# Patient Record
Sex: Male | Born: 1960 | Race: White | Hispanic: No | Marital: Married | Smoking: Never smoker
Health system: Southern US, Community
[De-identification: ages and names within clinical notes are randomized; demographics above are authoritative.]

## PROBLEM LIST (undated history)

## (undated) DIAGNOSIS — E785 Hyperlipidemia, unspecified: Secondary | ICD-10-CM

## (undated) DIAGNOSIS — K219 Gastro-esophageal reflux disease without esophagitis: Secondary | ICD-10-CM

## (undated) DIAGNOSIS — I10 Essential (primary) hypertension: Secondary | ICD-10-CM

## (undated) DIAGNOSIS — L570 Actinic keratosis: Secondary | ICD-10-CM

## (undated) DIAGNOSIS — E109 Type 1 diabetes mellitus without complications: Secondary | ICD-10-CM

## (undated) HISTORY — PX: DENTAL SURGERY: SHX609

## (undated) HISTORY — PX: COLONOSCOPY: SHX174

## (undated) HISTORY — PX: SHOULDER SURGERY: SHX246

## (undated) HISTORY — DX: Actinic keratosis: L57.0

---

## 2012-06-13 ENCOUNTER — Ambulatory Visit (HOSPITAL_COMMUNITY)
Admission: RE | Admit: 2012-06-13 | Discharge: 2012-06-13 | Disposition: A | Discharge status: Home / Self Care | Payer: 59 | Source: Ambulatory Visit | Attending: Specialist | Admitting: Specialist

## 2012-06-13 ENCOUNTER — Other Ambulatory Visit (HOSPITAL_COMMUNITY): Payer: Self-pay | Admitting: Specialist

## 2012-06-13 DIAGNOSIS — Z01818 Encounter for other preprocedural examination: Secondary | ICD-10-CM | POA: Insufficient documentation

## 2012-06-13 DIAGNOSIS — Z139 Encounter for screening, unspecified: Secondary | ICD-10-CM

## 2013-07-12 IMAGING — CR DG ORBITS FOR FOREIGN BODY
2 series · 2 of 2 positions shown · non-contrast
Comparison: None.

CLINICAL DATA: Clearance for MRI.  Foreign body.

ORBITS FOR FOREIGN BODY - 2 VIEW

[w waters (1 of 2)]
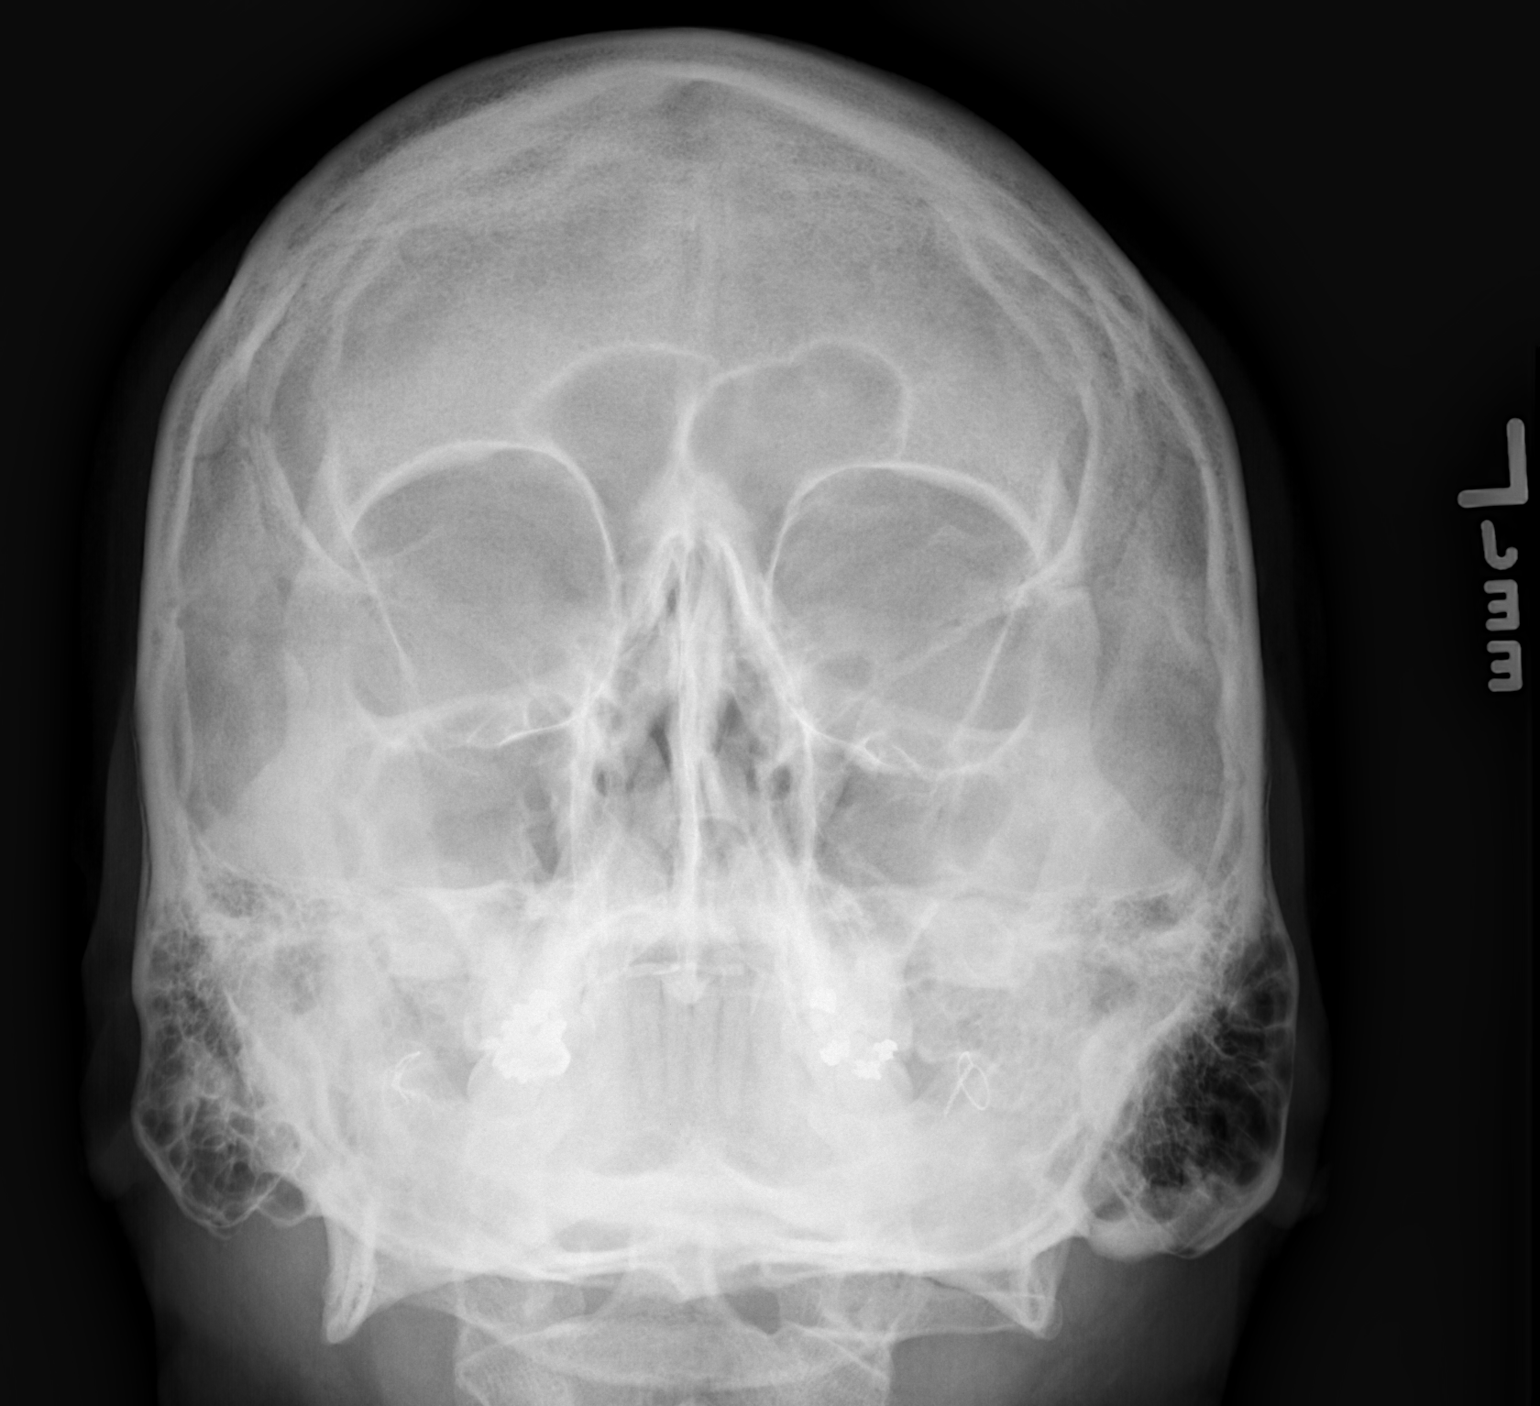

[w waters (2 of 2)]
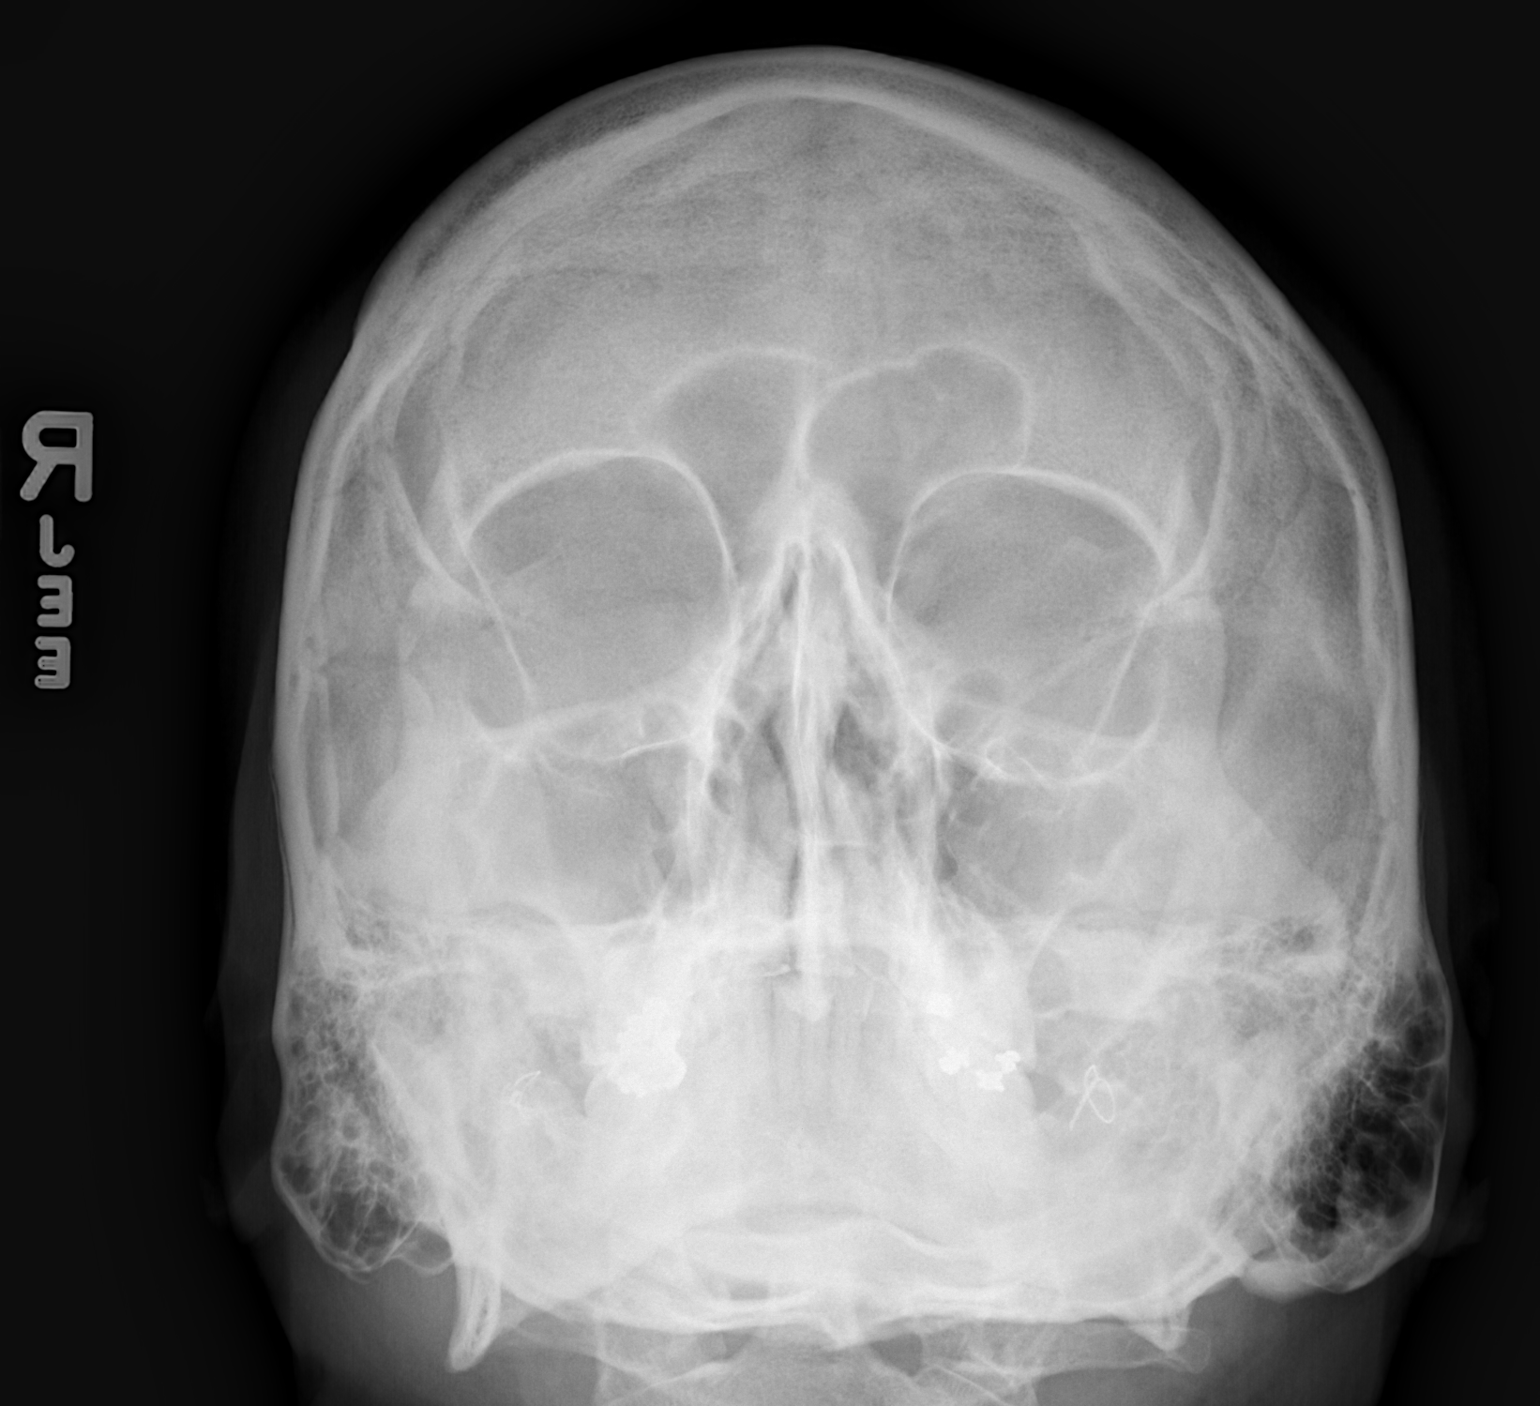

[2 of 2 positions shown; findings below may reference images not displayed]

FINDINGS: Two views of the orbits demonstrate no radiopaque foreign
body.  The sinuses are clear.
IMPRESSION: No radiopaque foreign body.

## 2014-10-02 DIAGNOSIS — D239 Other benign neoplasm of skin, unspecified: Secondary | ICD-10-CM

## 2014-10-02 HISTORY — DX: Other benign neoplasm of skin, unspecified: D23.9

## 2020-11-28 DIAGNOSIS — U071 COVID-19: Secondary | ICD-10-CM

## 2020-11-28 HISTORY — DX: COVID-19: U07.1

## 2021-07-22 ENCOUNTER — Ambulatory Visit: Payer: Self-pay | Admitting: Dermatology

## 2021-08-05 ENCOUNTER — Ambulatory Visit (INDEPENDENT_AMBULATORY_CARE_PROVIDER_SITE_OTHER): Payer: 59 | Admitting: Dermatology

## 2021-08-05 ENCOUNTER — Other Ambulatory Visit: Payer: Self-pay

## 2021-08-05 DIAGNOSIS — D2272 Melanocytic nevi of left lower limb, including hip: Secondary | ICD-10-CM | POA: Diagnosis not present

## 2021-08-05 DIAGNOSIS — D492 Neoplasm of unspecified behavior of bone, soft tissue, and skin: Secondary | ICD-10-CM

## 2021-08-05 DIAGNOSIS — L821 Other seborrheic keratosis: Secondary | ICD-10-CM

## 2021-08-05 DIAGNOSIS — L814 Other melanin hyperpigmentation: Secondary | ICD-10-CM

## 2021-08-05 DIAGNOSIS — L578 Other skin changes due to chronic exposure to nonionizing radiation: Secondary | ICD-10-CM | POA: Diagnosis not present

## 2021-08-05 DIAGNOSIS — L820 Inflamed seborrheic keratosis: Secondary | ICD-10-CM

## 2021-08-05 DIAGNOSIS — Z1283 Encounter for screening for malignant neoplasm of skin: Secondary | ICD-10-CM | POA: Diagnosis not present

## 2021-08-05 DIAGNOSIS — B078 Other viral warts: Secondary | ICD-10-CM

## 2021-08-05 DIAGNOSIS — L570 Actinic keratosis: Secondary | ICD-10-CM | POA: Diagnosis not present

## 2021-08-05 DIAGNOSIS — D229 Melanocytic nevi, unspecified: Secondary | ICD-10-CM

## 2021-08-05 DIAGNOSIS — D1800 Hemangioma unspecified site: Secondary | ICD-10-CM

## 2021-08-05 NOTE — Patient Instructions (Addendum)
Wound Care Instructions  Cleanse wound gently with soap and water once a day then pat dry with clean gauze. Apply a thing coat of Petrolatum (petroleum jelly, "Vaseline") over the wound (unless you have an allergy to this). We recommend that you use a new, sterile tube of Vaseline. Do not pick or remove scabs. Do not remove the yellow or white "healing tissue" from the base of the wound.  Cover the wound with fresh, clean, nonstick gauze and secure with paper tape. You may use Band-Aids in place of gauze and tape if the would is small enough, but would recommend trimming much of the tape off as there is often too much. Sometimes Band-Aids can irritate the skin.  You should call the office for your biopsy report after 1 week if you have not already been contacted.  If you experience any problems, such as abnormal amounts of bleeding, swelling, significant bruising, significant pain, or evidence of infection, please call the office immediately.  FOR ADULT SURGERY PATIENTS: If you need something for pain relief you may take 1 extra strength Tylenol (acetaminophen) AND 2 Ibuprofen (200mg each) together every 4 hours as needed for pain. (do not take these if you are allergic to them or if you have a reason you should not take them.) Typically, you may only need pain medication for 1 to 3 days.    Cryotherapy Aftercare  Wash gently with soap and water everyday.   Apply Vaseline and Band-Aid daily until healed.    If you have any questions or concerns for your doctor, please call our main line at [phone] and press option 4 to reach your doctor's medical assistant. If no one answers, please leave a voicemail as directed and we will return your call as soon as possible. Messages left after 4 pm will be answered the following business day.   You may also send us a message via MyChart. We typically respond to MyChart messages within 1-2 business days.  For prescription refills, please ask your  pharmacy to contact our office. Our fax number is [phone].  If you have an urgent issue when the clinic is closed that cannot wait until the next business day, you can page your doctor at the number below.    Please note that while we do our best to be available for urgent issues outside of office hours, we are not available 24/7.   If you have an urgent issue and are unable to reach us, you may choose to seek medical care at your doctor's office, retail clinic, urgent care center, or emergency room.  If you have a medical emergency, please immediately call 911 or go to the emergency department.  Pager Numbers  - Dr. Kowalski: [phone]  - Dr. Moye: [phone]  - Dr. Stewart: [phone]  In the event of inclement weather, please call our main line at [phone] for an update on the status of any delays or closures.  Dermatology Medication Tips: Please keep the boxes that topical medications come in in order to help keep track of the instructions about where and how to use these. Pharmacies typically print the medication instructions only on the boxes and not directly on the medication tubes.   If your medication is too expensive, please contact our office at [phone] option 4 or send us a message through MyChart.   We are unable to tell what your co-pay for medications will be in advance as this is different depending on your insurance coverage. However,   we may be able to find a substitute medication at lower cost or fill out paperwork to get insurance to cover a needed medication.   If a prior authorization is required to get your medication covered by your insurance company, please allow us 1-2 business days to complete this process.  Drug prices often vary depending on where the prescription is filled and some pharmacies may offer cheaper prices.  The website www.goodrx.com contains coupons for medications through different pharmacies. The prices here do not  account for what the cost may be with help from insurance (it may be cheaper with your insurance), but the website can give you the price if you did not use any insurance.  - You can print the associated coupon and take it with your prescription to the pharmacy.  - You may also stop by our office during regular business hours and pick up a GoodRx coupon card.  - If you need your prescription sent electronically to a different pharmacy, notify our office through [place] MyChart or by phone at [phone] option 4.  

## 2021-08-05 NOTE — Progress Notes (Signed)
New Patient Visit  Subjective  Jack Edwards is a 60 y.o. male who presents for the following: Annual Exam (Mole check ). Pt c/o several spots changing and growing.  The patient presents for Total-Body Skin Exam (TBSE) for skin cancer screening and mole check.   The following portions of the chart were reviewed this encounter and updated as appropriate:   Allergies  Meds  Problems  Med Hx  Surg Hx  Fam Hx     Review of Systems:  No other skin or systemic complaints except as noted in HPI or Assessment and Plan.  Objective  Well appearing patient in no apparent distress; mood and affect are within normal limits.  A full examination was performed including scalp, head, eyes, ears, nose, lips, neck, chest, axillae, abdomen, back, buttocks, bilateral upper extremities, bilateral lower extremities, hands, feet, fingers, toes, fingernails, and toenails. All findings within normal limits unless otherwise noted below.  right infraorbital Erythematous thin papules/macules with gritty scale.   Right Forearm - Anterior Erythematous keratotic or waxy stuck-on papule or plaque.   midline anterior sole 0.7 x 0.3 irregular brown macule      left anterior sole Verrucous papules -- Discussed viral etiology and contagion.    Assessment & Plan  AK (actinic keratosis) right infraorbital  Destruction of lesion - right infraorbital Complexity: simple   Destruction method: cryotherapy   Informed consent: discussed and consent obtained   Timeout:  patient name, date of birth, surgical site, and procedure verified Lesion destroyed using liquid nitrogen: Yes   Region frozen until ice ball extended beyond lesion: Yes   Outcome: patient tolerated procedure well with no complications   Post-procedure details: wound care instructions given    Inflamed seborrheic keratosis Right Forearm - Anterior  Destruction of lesion - Right Forearm - Anterior Complexity: simple   Destruction  method: cryotherapy   Informed consent: discussed and consent obtained   Timeout:  patient name, date of birth, surgical site, and procedure verified Lesion destroyed using liquid nitrogen: Yes   Region frozen until ice ball extended beyond lesion: Yes   Outcome: patient tolerated procedure well with no complications   Post-procedure details: wound care instructions given    Neoplasm of skin midline anterior sole  Epidermal / dermal shaving  Lesion diameter (cm):  0.7 Informed consent: discussed and consent obtained   Timeout: patient name, date of birth, surgical site, and procedure verified   Procedure prep:  Patient was prepped and draped in usual sterile fashion Prep type:  Isopropyl alcohol Anesthesia: the lesion was anesthetized in a standard fashion   Anesthetic:  1% lidocaine w/ epinephrine 1-100,000 buffered w/ 8.4% NaHCO3 Hemostasis achieved with: pressure, aluminum chloride and electrodesiccation   Outcome: patient tolerated procedure well   Post-procedure details: sterile dressing applied and wound care instructions given   Dressing type: bandage and petrolatum    Specimen 1 - Surgical pathology Differential Diagnosis: R/O Dysplastic nevus   Check Margins: No  Irregular brown macule   Other viral warts left anterior sole  Destruction of lesion - left anterior sole Complexity: simple   Destruction method: cryotherapy   Informed consent: discussed and consent obtained   Timeout:  patient name, date of birth, surgical site, and procedure verified Lesion destroyed using liquid nitrogen: Yes   Region frozen until ice ball extended beyond lesion: Yes   Outcome: patient tolerated procedure well with no complications   Post-procedure details: wound care instructions given    Skin cancer screening  Lentigines - Scattered tan macules - Due to sun exposure - Benign-appering, observe - Recommend daily broad spectrum sunscreen SPF 30+ to sun-exposed areas, reapply  every 2 hours as needed. - Call for any changes  Seborrheic Keratoses - Stuck-on, waxy, tan-brown papules and/or plaques  - Benign-appearing - Discussed benign etiology and prognosis. - Observe - Call for any changes  Melanocytic Nevi - Tan-brown and/or pink-flesh-colored symmetric macules and papules - Benign appearing on exam today - Observation - Call clinic for new or changing moles - Recommend daily use of broad spectrum spf 30+ sunscreen to sun-exposed areas.   Hemangiomas - Red papules - Discussed benign nature - Observe - Call for any changes  Actinic Damage - Chronic condition, secondary to cumulative UV/sun exposure - diffuse scaly erythematous macules with underlying dyspigmentation - Recommend daily broad spectrum sunscreen SPF 30+ to sun-exposed areas, reapply every 2 hours as needed.  - Staying in the shade or wearing long sleeves, sun glasses (UVA+UVB protection) and wide brim hats (4-inch brim around the entire circumference of the hat) are also recommended for sun protection.  - Call for new or changing lesions.  Skin cancer screening performed today.   Return in about 1 year (around 08/05/2022) for TBSE.  IMarye Round, CMA, am acting as scribe for Sarina Ser, MD .  Documentation: I have reviewed the above documentation for accuracy and completeness, and I agree with the above.  Sarina Ser, MD

## 2021-08-09 ENCOUNTER — Encounter: Payer: Self-pay | Admitting: Dermatology

## 2021-08-18 ENCOUNTER — Telehealth: Payer: Self-pay

## 2021-08-18 NOTE — Telephone Encounter (Signed)
Discussed biopsy results with pt  °

## 2021-08-18 NOTE — Telephone Encounter (Signed)
-----   Message from Ralene Bathe, MD sent at 08/13/2021  5:42 PM EDT ----- Diagnosis Skin , left midline anterior sole MELANOCYTIC NEVUS, COMPOUND ACRAL TYPE, BASE INVOLVED  Benign mole No further treatment needed Recheck next visit

## 2022-01-07 ENCOUNTER — Encounter: Payer: Self-pay | Admitting: Unknown Physician Specialty

## 2022-01-12 ENCOUNTER — Encounter: Payer: Self-pay | Admitting: Unknown Physician Specialty

## 2022-01-15 NOTE — Discharge Instructions (Signed)
[facility] ENDOSCOPIC SINUS SURGERY [facility]  What is Functional Endoscopic Sinus Surgery?  The Surgery involves making the natural openings of the sinuses larger by removing the bony partitions that separate the sinuses from the nasal cavity.  The natural sinus lining is preserved as much as possible to allow the sinuses to resume normal function after the surgery.  In some patients nasal polyps (excessively swollen lining of the sinuses) may be removed to relieve obstruction of the sinus openings.  The surgery is performed through the nose using lighted scopes, which eliminates the need for incisions on the face.  A septoplasty is a different procedure which is sometimes performed with sinus surgery.  It involves straightening the boy partition that separates the two sides of your nose.  A crooked or deviated septum may need repair if is obstructing the sinuses or nasal airflow.  Turbinate reduction is also often performed during sinus surgery.  The turbinates are bony proturberances from the side walls of the nose which swell and can obstruct the nose in patients with sinus and allergy problems.  Their size can be surgically reduced to help relieve nasal obstruction.  What Can Sinus Surgery Do For Me?  Sinus surgery can reduce the frequency of sinus infections requiring antibiotic treatment.  This can provide improvement in nasal congestion, post-nasal drainage, facial pressure and nasal obstruction.  Surgery will NOT prevent you from ever having an infection again, so it usually only for patients who get infections 4 or more times yearly requiring antibiotics, or for infections that do not clear with antibiotics.  It will not cure nasal allergies, so patients with allergies may still require medication to treat their allergies after surgery. Surgery may improve headaches related to sinusitis, however, some people will continue to  require medication to control sinus headaches related to allergies.  Surgery will do nothing for other forms of headache (migraine, tension or cluster).  What Are the Risks of Endoscopic Sinus Surgery?  Current techniques allow surgery to be performed safely with little risk, however, there are rare complications that patients should be aware of.  Because the sinuses are located around the eyes, there is risk of eye injury, including blindness, though again, this would be quite rare. This is usually a result of bleeding behind the eye during surgery, which can effect vision, though there are treatments to protect the vision and prevent permanent injury. More serious complications would include bleeding inside the brain cavity or damage to the brain.This happens when the fluid around the brain leaks out into the sinus cavity.  Again, all of these complications are uncommon, and spinal fluid leaks can be safely managed surgically if they occur.  The most common complication of sinus surgery is bleeding from the nose, which may require packing or cauterization of the nose.  Patients with polyps may experience recurrence of the polyps that would require revision surgery.  Alterations of sense of smell or injury to the tear ducts are also rare complications.   What is the Surgery Like, and what is the Recovery?  The Surgery usually takes a couple of hours to perform, and is usually performed under a general anesthetic (completely asleep).  Patients are usually discharged home after a couple of hours.  Sometimes during surgery it is necessary to pack the nose to control bleeding, and the packing is left in place for 24 - 48 hours, and removed by your surgeon.  If   a septoplasty was performed during the procedure, there is often a splint placed which must be removed after 5-7 days.   Discomfort: Pain is usually mild to moderate, and can be controlled by prescription pain medication or acetaminophen (Tylenol).   Aspirin, Ibuprofen (Advil, Motrin), or Naprosyn (Aleve) should be avoided, as they can cause increased bleeding.  Most patients feel sinus pressure like they have a bad head cold for several days.  Sleeping with your head elevated can help reduce swelling and facial pressure, as can ice packs over the face.  A humidifier may be helpful to keep the mucous and blood from drying in the nose.   Diet: There are no specific diet restrictions, however, you should generally start with clear liquids and a light diet of bland foods because the anesthetic can cause some nausea.  Advance your diet depending on how your stomach feels.  Taking your pain medication with food will often help reduce stomach upset which pain medications can cause.  Nasal Saline Irrigation: It is important to remove blood clots and dried mucous from the nose as it is healing.  This is done by having you irrigate the nose at least 3 - 4 times daily with a salt water solution.  We recommend using NeilMed Sinus Rinse (available at the drug store).  Fill the squeeze bottle with the solution, bend over a sink, and insert the tip of the squeeze bottle into the nose  of an inch.  Point the tip of the squeeze bottle towards the inside corner of the eye on the same side your irrigating.  Squeeze the bottle and gently irrigate the nose.  If you bend forward as you do this, most of the fluid will flow back out of the nose, instead of down your throat.   The solution should be warm, near body temperature, when you irrigate.   Each time you irrigate, you should use a full squeeze bottle.   Note that if you are instructed to use Nasal Steroid Sprays at any time after your surgery, irrigate with saline BEFORE using the steroid spray, so you do not wash it all out of the nose. Another product, Nasal Saline Gel (such as AYR Nasal Saline Gel) can be applied in each nostril 3 - 4 times daily to moisture the nose and reduce scabbing or crusting.  Bleeding:   Bloody drainage from the nose can be expected for several days, and patients are instructed to irrigate their nose frequently with salt water to help remove mucous and blood clots.  The drainage may be dark red or brown, though some fresh blood may be seen intermittently, especially after irrigation.  Do not blow you nose, as bleeding may occur. If you must sneeze, keep your mouth open to allow air to escape through your mouth.  If heavy bleeding occurs: Irrigate the nose with saline to rinse out clots, then spray the nose 3 - 4 times with Afrin Nasal Decongestant Spray.  The spray will constrict the blood vessels to slow bleeding.  Pinch the lower half of your nose shut to apply pressure, and lay down with your head elevated.  Ice packs over the nose may help as well. If bleeding persists despite these measures, you should notify your doctor.  Do not use the Afrin routinely to control nasal congestion after surgery, as it can result in worsening congestion and may affect healing.     Activity: Return to work varies among patients. Most patients will be out   of work at least 5 - 7 days to recover.  Patient may return to work after they are off of narcotic pain medication, and feeling well enough to perform the functions of their job.  Patients must avoid heavy lifting (over 10 pounds) or strenuous physical for 2 weeks after surgery, so your employer may need to assign you to light duty, or keep you out of work longer if light duty is not possible.  NOTE: you should not drive, operate dangerous machinery, do any mentally demanding tasks or make any important legal or financial decisions while on narcotic pain medication and recovering from the general anesthetic.    Call Your Doctor Immediately if You Have Any of the Following: Bleeding that you cannot control with the above measures Loss of vision, double vision, bulging of the eye or black eyes. Fever over 101 degrees Neck stiffness with severe headache,  fever, nausea and change in mental state. You are always encouraged to call anytime with concerns, however, please call with requests for pain medication refills during office hours.  Office Endoscopy: During follow-up visits your doctor will remove any packing or splints that may have been placed and evaluate and clean your sinuses endoscopically.  Topical anesthetic will be used to make this as comfortable as possible, though you may want to take your pain medication prior to the visit.  How often this will need to be done varies from patient to patient.  After complete recovery from the surgery, you may need follow-up endoscopy from time to time, particularly if there is concern of recurrent infection or nasal polyps.  

## 2022-01-22 ENCOUNTER — Other Ambulatory Visit: Payer: Self-pay

## 2022-01-22 ENCOUNTER — Ambulatory Visit
Admission: RE | Admit: 2022-01-22 | Discharge: 2022-01-22 | Disposition: A | Discharge status: Home / Self Care | Payer: 59 | Source: Ambulatory Visit | Attending: Unknown Physician Specialty | Admitting: Unknown Physician Specialty

## 2022-01-22 ENCOUNTER — Encounter: Payer: Self-pay | Admitting: Unknown Physician Specialty

## 2022-01-22 ENCOUNTER — Ambulatory Visit: Payer: 59 | Admitting: Anesthesiology

## 2022-01-22 ENCOUNTER — Encounter
Admission: RE | Disposition: A | Discharge status: Home / Self Care | Payer: Self-pay | Source: Ambulatory Visit | Attending: Unknown Physician Specialty

## 2022-01-22 DIAGNOSIS — J343 Hypertrophy of nasal turbinates: Secondary | ICD-10-CM | POA: Diagnosis present

## 2022-01-22 DIAGNOSIS — E669 Obesity, unspecified: Secondary | ICD-10-CM | POA: Diagnosis not present

## 2022-01-22 DIAGNOSIS — Z6833 Body mass index (BMI) 33.0-33.9, adult: Secondary | ICD-10-CM | POA: Insufficient documentation

## 2022-01-22 HISTORY — DX: Type 1 diabetes mellitus without complications: E10.9

## 2022-01-22 HISTORY — DX: Hyperlipidemia, unspecified: E78.5

## 2022-01-22 HISTORY — PX: NASAL TURBINATE REDUCTION: SHX2072

## 2022-01-22 HISTORY — DX: Gastro-esophageal reflux disease without esophagitis: K21.9

## 2022-01-22 HISTORY — DX: Essential (primary) hypertension: I10

## 2022-01-22 LAB — GLUCOSE, CAPILLARY
Glucose-Capillary: 119 mg/dL — ABNORMAL HIGH (ref 70–99)
Glucose-Capillary: 94 mg/dL (ref 70–99)

## 2022-01-22 SURGERY — EXCISION, NASAL TURBINATE, SUBMUCOSAL
Anesthesia: General | Site: Nose | Laterality: Bilateral

## 2022-01-22 MED ORDER — ONDANSETRON HCL 4 MG/2ML IJ SOLN
INTRAMUSCULAR | Status: DC | PRN
  Administered 2022-01-22: 4 mg via INTRAVENOUS

## 2022-01-22 MED ORDER — SUCCINYLCHOLINE CHLORIDE 200 MG/10ML IV SOSY
PREFILLED_SYRINGE | INTRAVENOUS | Status: DC | PRN
  Administered 2022-01-22: 110 mg via INTRAVENOUS

## 2022-01-22 MED ORDER — FENTANYL CITRATE (PF) 100 MCG/2ML IJ SOLN
INTRAMUSCULAR | Status: DC | PRN
Start: 2022-01-22 — End: 2022-01-22
  Administered 2022-01-22: 100 ug via INTRAVENOUS

## 2022-01-22 MED ORDER — EPHEDRINE SULFATE (PRESSORS) 50 MG/ML IJ SOLN
INTRAMUSCULAR | Status: DC | PRN
Start: 2022-01-22 — End: 2022-01-22
  Administered 2022-01-22 (×2): 10 mg via INTRAVENOUS

## 2022-01-22 MED ORDER — OXYMETAZOLINE HCL 0.05 % NA SOLN
6.0000 | Freq: Once | NASAL | Status: AC
  Administered 2022-01-22: 6 via NASAL

## 2022-01-22 MED ORDER — PHENYLEPHRINE HCL 0.5 % NA SOLN
NASAL | Status: DC | PRN
  Administered 2022-01-22: 15 mL via TOPICAL

## 2022-01-22 MED ORDER — DEXMEDETOMIDINE (PRECEDEX) IN NS 20 MCG/5ML (4 MCG/ML) IV SYRINGE
PREFILLED_SYRINGE | INTRAVENOUS | Status: DC | PRN
  Administered 2022-01-22: 10 ug via INTRAVENOUS

## 2022-01-22 MED ORDER — PHENYLEPHRINE HCL (PRESSORS) 10 MG/ML IV SOLN
INTRAVENOUS | Status: DC | PRN
  Administered 2022-01-22: 50 ug via INTRAVENOUS

## 2022-01-22 MED ORDER — LIDOCAINE HCL (CARDIAC) PF 100 MG/5ML IV SOSY
PREFILLED_SYRINGE | INTRAVENOUS | Status: DC | PRN
Start: 2022-01-22 — End: 2022-01-22
  Administered 2022-01-22: 50 mg via INTRAVENOUS

## 2022-01-22 MED ORDER — HYDROCODONE-ACETAMINOPHEN 5-300 MG PO TABS: 1.0000 | ORAL_TABLET | ORAL | 0 refills | Status: DC | PRN

## 2022-01-22 MED ORDER — SULFAMETHOXAZOLE-TRIMETHOPRIM 800-160 MG PO TABS: 1.0000 | ORAL_TABLET | Freq: Two times a day (BID) | ORAL | 0 refills | Status: DC

## 2022-01-22 MED ORDER — DEXAMETHASONE SODIUM PHOSPHATE 4 MG/ML IJ SOLN
INTRAMUSCULAR | Status: DC | PRN
  Administered 2022-01-22: 8 mg via INTRAVENOUS

## 2022-01-22 MED ORDER — LIDOCAINE-EPINEPHRINE 1 %-1:100000 IJ SOLN
INTRAMUSCULAR | Status: DC | PRN
  Administered 2022-01-22: 3 mL

## 2022-01-22 MED ORDER — LACTATED RINGERS IV SOLN: INTRAVENOUS | Status: DC

## 2022-01-22 MED ORDER — GLYCOPYRROLATE 0.2 MG/ML IJ SOLN
INTRAMUSCULAR | Status: DC | PRN
Start: 2022-01-22 — End: 2022-01-22
  Administered 2022-01-22: .1 mg via INTRAVENOUS

## 2022-01-22 MED ORDER — MIDAZOLAM HCL 5 MG/5ML IJ SOLN
INTRAMUSCULAR | Status: DC | PRN
Start: 2022-01-22 — End: 2022-01-22
  Administered 2022-01-22: 2 mg via INTRAVENOUS

## 2022-01-22 MED ORDER — PROPOFOL 10 MG/ML IV BOLUS
INTRAVENOUS | Status: DC | PRN
  Administered 2022-01-22: 200 mg via INTRAVENOUS

## 2022-01-22 SURGICAL SUPPLY — 25 items
COAG SUCT 10F 3.5MM HAND CTRL (MISCELLANEOUS) ×2 IMPLANT
DRAPE HEAD BAR (DRAPES) ×2 IMPLANT
DRESSING NASL FOAM PST OP SINU (MISCELLANEOUS) IMPLANT
DRSG NASAL FOAM POST OP SINU (MISCELLANEOUS) ×4
ELECT REM PT RETURN 9FT ADLT (ELECTROSURGICAL) ×2
ELECTRODE REM PT RTRN 9FT ADLT (ELECTROSURGICAL) ×1 IMPLANT
GLOVE SURG ENC TEXT LTX SZ7.5 (GLOVE) ×4 IMPLANT
HANDLE YANKAUER SUCT BULB TIP (MISCELLANEOUS) ×2 IMPLANT
KIT TURNOVER KIT A (KITS) ×2 IMPLANT
NDL HYPO 25GX1X1/2 BEV (NEEDLE) ×1 IMPLANT
NEEDLE HYPO 25GX1X1/2 BEV (NEEDLE) ×2 IMPLANT
PACK ENT CUSTOM (PACKS) ×2 IMPLANT
SPONGE NEURO XRAY DETECT 1X3 (DISPOSABLE) ×2 IMPLANT
STRAP BODY AND KNEE 60X3 (MISCELLANEOUS) ×2 IMPLANT
SUT CHROMIC 3-0 (SUTURE) ×2
SUT CHROMIC 3-0 KS 27XMFL CR (SUTURE) ×1
SUT CHROMIC 5-0 (SUTURE) ×2
SUT CHROMIC 5-0 P2 18XMFL CR (SUTURE) ×1
SUT ETHILON 3-0 KS 30 BLK (SUTURE) ×2 IMPLANT
SUT PLAIN GUT 4-0 (SUTURE) ×1 IMPLANT
SUTURE CHRMC 3-0 KS 27XMFL CR (SUTURE) ×1 IMPLANT
SUTURE CHRMC 5-0 P2 18XMF CR (SUTURE) IMPLANT
SYR 10ML LL (SYRINGE) ×2 IMPLANT
TOWEL OR 17X26 4PK STRL BLUE (TOWEL DISPOSABLE) ×2 IMPLANT
WATER STERILE IRR 250ML POUR (IV SOLUTION) ×2 IMPLANT

## 2022-01-22 NOTE — Anesthesia Preprocedure Evaluation (Signed)
Anesthesia Evaluation  Patient identified by MRN, date of birth, ID band Patient awake    Reviewed: Allergy & Precautions, NPO status , Patient's Chart, lab work & pertinent test results, reviewed documented beta blocker date and time   History of Anesthesia Complications Negative for: history of anesthetic complications  Airway Mallampati: III  TM Distance: >3 FB Neck ROM: Full  Mouth opening: Limited Mouth Opening  Dental   Pulmonary    breath sounds clear to auscultation       Cardiovascular hypertension, (-) angina(-) DOE  Rhythm:Regular Rate:Normal   HLD   Neuro/Psych    GI/Hepatic GERD  ,  Endo/Other  diabetes, Type 1  Renal/GU      Musculoskeletal   Abdominal (+) + obese (BMI 33),   Peds  Hematology   Anesthesia Other Findings   Reproductive/Obstetrics                            Anesthesia Physical Anesthesia Plan  ASA: 2  Anesthesia Plan: General   Post-op Pain Management:    Induction: Intravenous  PONV Risk Score and Plan: 2 and Treatment may vary due to age or medical condition and Ondansetron  Airway Management Planned: Oral ETT  Additional Equipment:   Intra-op Plan:   Post-operative Plan: Extubation in OR  Informed Consent: I have reviewed the patients History and Physical, chart, labs and discussed the procedure including the risks, benefits and alternatives for the proposed anesthesia with the patient or authorized representative who has indicated his/her understanding and acceptance.       Plan Discussed with: CRNA and Anesthesiologist  Anesthesia Plan Comments:         Anesthesia Quick Evaluation

## 2022-01-22 NOTE — Anesthesia Postprocedure Evaluation (Signed)
Anesthesia Post Note  Patient: Jack Edwards  Procedure(s) Performed: [name] REDUCTION/SUBMUCOSAL RESECTION (Bilateral: Nose)     Patient location during evaluation: PACU Anesthesia Type: General Level of consciousness: awake and alert Pain management: pain level controlled Vital Signs Assessment: post-procedure vital signs reviewed and stable Respiratory status: spontaneous breathing, nonlabored ventilation, respiratory function stable and patient connected to nasal cannula oxygen Cardiovascular status: blood pressure returned to baseline and stable Postop Assessment: no apparent nausea or vomiting Anesthetic complications: no   No notable events documented.  Chet Greenley A  Lorene Klimas

## 2022-01-22 NOTE — Anesthesia Procedure Notes (Signed)
Procedure Name: Intubation Date/Time: 01/22/2022 10:14 AM Performed by: Cameron Ali, CRNA Pre-anesthesia Checklist: Patient identified, Emergency Drugs available, Suction available, Patient being monitored and Timeout performed Patient Re-evaluated:Patient Re-evaluated prior to induction Oxygen Delivery Method: Circle system utilized Preoxygenation: Pre-oxygenation with 100% oxygen Induction Type: IV induction Ventilation: Mask ventilation without difficulty Laryngoscope Size: Glidescope and 4 Grade View: Grade I Tube type: Oral Rae Tube size: 8.0 mm Number of attempts: 2 Placement Confirmation: ETT inserted through vocal cords under direct vision, positive ETCO2 and breath sounds checked- equal and bilateral Tube secured with: Tape Dental Injury: Teeth and Oropharynx as per pre-operative assessment  Comments: DVL x 1. No view with MAC 3. Small mouth opening. DVL x 1 with Glidescope as charted.

## 2022-01-22 NOTE — Op Note (Signed)
01/22/2022  10:36 AM    Jack Edwards  [id number]   Pre-Op Dx: Hypertrophy of nasal turbinates  Post-op Dx: SAME  Proc: Bilateral submucous resection of the inferior turbinate  Surg:  Roena Malady  Anes:  GOT  EBL: Less than 10 cc  Comp: None  Findings: Hypertrophied inferior turbinates bilaterally  Procedure: Jack Edwards was identified in the holding area taken the operating placed in supine position.  After general trach anesthesia the table was turned 90 degrees the patient was draped in usual fashion for nasal surgery.  Examination of the nose shows bilateral inferior turbinate hypertrophy.  A topical anesthetic of phenylephrine lidocaine solution was placed within each nostril on cottonoid pledgets.  This was left proximately 5 minutes.  A local anesthetic of 1% lidocaine with 1 100,000 units of epinephrine was used to inject the inferior turbinates bilaterally.  A total of 3 cc was used.  The patient draped usual fashion for sinus surgery beginning on the left-hand side a 15 blade was used to create an incision on the inferior edge of the inferior turbinate.  A superiorly laterally based flap was then elevated the knight scissors were then used to excise the underlying chonchal bone and mucosa.  The superior laterally based flap was then laid back over the turbinate stump and the suction cautery was used to cauterize the inferior surface for hemostasis.  In similar fashion submucous resection was performed on the right with this completed and no active bleeding Stammberger gel was placed beneath each inferior turbinate there was no active bleeding.  Patient presented to return to anesthesia where he was awakened in the operating room and taken to the recovery room in stable condition.  Specimen: None  Dispo:   Good  Plan: Discharged home follow-up 1 week  Roena Malady  01/22/2022 10:36 AM

## 2022-01-22 NOTE — Transfer of Care (Signed)
Immediate Anesthesia Transfer of Care Note  Patient: Jack Edwards  Procedure(s) Performed: [name] REDUCTION/SUBMUCOSAL RESECTION (Bilateral: Nose)  Patient Location: PACU  Anesthesia Type: General  Level of Consciousness: awake, alert  and patient cooperative  Airway and Oxygen Therapy: Patient Spontanous Breathing and Patient connected to supplemental oxygen  Post-op Assessment: Post-op Vital signs reviewed, Patient's Cardiovascular Status Stable, Respiratory Function Stable, Patent Airway and No signs of Nausea or vomiting  Post-op Vital Signs: Reviewed and stable  Complications: No notable events documented.

## 2022-01-22 NOTE — H&P (Signed)
The patient's history has been reviewed, patient examined, no change in status, stable for surgery.  Questions were answered to the patients satisfaction.  

## 2022-04-30 ENCOUNTER — Emergency Department: Payer: 59

## 2022-04-30 ENCOUNTER — Emergency Department
Admission: EM | Admit: 2022-04-30 | Discharge: 2022-04-30 | Disposition: A | Discharge status: Home / Self Care | Payer: 59 | Attending: Emergency Medicine | Admitting: Emergency Medicine

## 2022-04-30 ENCOUNTER — Other Ambulatory Visit: Payer: Self-pay

## 2022-04-30 DIAGNOSIS — E109 Type 1 diabetes mellitus without complications: Secondary | ICD-10-CM | POA: Insufficient documentation

## 2022-04-30 DIAGNOSIS — I10 Essential (primary) hypertension: Secondary | ICD-10-CM | POA: Insufficient documentation

## 2022-04-30 DIAGNOSIS — I2510 Atherosclerotic heart disease of native coronary artery without angina pectoris: Secondary | ICD-10-CM | POA: Insufficient documentation

## 2022-04-30 DIAGNOSIS — R0789 Other chest pain: Secondary | ICD-10-CM | POA: Diagnosis not present

## 2022-04-30 DIAGNOSIS — R072 Precordial pain: Secondary | ICD-10-CM | POA: Diagnosis present

## 2022-04-30 LAB — BASIC METABOLIC PANEL
Anion gap: 6 (ref 5–15)
BUN: 14 mg/dL (ref 8–23)
CO2: 24 mmol/L (ref 22–32)
Calcium: 8.7 mg/dL — ABNORMAL LOW (ref 8.9–10.3)
Chloride: 108 mmol/L (ref 98–111)
Creatinine, Ser: 1.03 mg/dL (ref 0.61–1.24)
GFR, Estimated: >60 mL/min (ref >60)
Glucose, Bld: 122 mg/dL — ABNORMAL HIGH (ref 70–99)
Potassium: 3.9 mmol/L (ref 3.5–5.1)
Sodium: 138 mmol/L (ref 135–145)

## 2022-04-30 LAB — CBC
HCT: 44.2 % (ref 39.0–52.0)
Hemoglobin: 14.5 g/dL (ref 13.0–17.0)
MCH: 27.5 pg (ref 26.0–34.0)
MCHC: 32.8 g/dL (ref 30.0–36.0)
MCV: 83.7 fL (ref 80.0–100.0)
Platelets: 247 10*3/uL (ref 150–400)
RBC: 5.28 MIL/uL (ref 4.22–5.81)
RDW: 13.4 % (ref 11.5–15.5)
WBC: 7.9 10*3/uL (ref 4.0–10.5)
nRBC: 0 % (ref 0.0–0.2)

## 2022-04-30 LAB — D-DIMER, QUANTITATIVE: D-Dimer, Quant: <0.27 ug/mL-FEU (ref 0.00–0.50)

## 2022-04-30 LAB — TROPONIN I (HIGH SENSITIVITY)
Troponin I (High Sensitivity): 4 ng/L (ref <18)
Troponin I (High Sensitivity): 5 ng/L (ref <18)

## 2022-04-30 NOTE — ED Provider Notes (Signed)
? ?[facility] ?Provider Note ? ? ? Event Date/Time  ? First MD Initiated Contact with Patient 04/30/22 0158   ?  (approximate) ? ? ?History  ? ?Chest Pain ? ? ?HPI ? ?Jack Edwards is a 61 y.o. male who presents to the ED for evaluation of Chest Pain ?  ?I reviewed endocrine clinic visit from 2 months ago.  History of type 1 diabetes, obesity, HTN, HLD on an insulin pump. ? ?Patient presents to the ED for evaluation of sharp right-sided chest pain in the past few hours.  Reports pain starting on the right side of his chest, and now is more substernal.  Reports pleuritic component/worsening of this.  Denies any falls, injuries or trauma.  Denies shortness of breath, cough, fever, syncope, emesis or back pain. ? ?Physical Exam  ? ?Triage Vital Signs: ?ED Triage Vitals  ?Enc Vitals Group  ?   BP 04/30/22 0053 (!) 152/91  ?   Pulse Rate 04/30/22 0053 87  ?   Resp 04/30/22 0053 18  ?   Temp 04/30/22 0053 98.5 ?F (36.9 ?C)  ?   Temp Source 04/30/22 0053 Oral  ?   SpO2 04/30/22 0053 99 %  ?   Weight 04/30/22 0053 260 lb (117.9 kg)  ?   Height 04/30/22 0053 '6\' 4"'$  (1.93 m)  ?   Head Circumference --   ?   Peak Flow --   ?   Pain Score 04/30/22 0052 3  ?   Pain Loc --   ?   Pain Edu? --   ?   Excl. in [place]? --   ? ? ?Most recent vital signs: ?Vitals:  ? 04/30/22 0300 04/30/22 0330  ?BP: 135/86 135/88  ?Pulse: 79 82  ?Resp: 15 17  ?Temp:  98.3 ?F (36.8 ?C)  ?SpO2: 94% 97%  ? ? ?General: Awake, no distress.  Pleasant and conversational in full sentences.  Well-appearing. ?CV:  Good peripheral perfusion. RRR ?Resp:  Normal effort. CTAB ?Abd:  No distention.  ?MSK:  No deformity noted.  Some reproduction with palpation but no overlying skin changes, signs of trauma or rash ?Neuro:  No focal deficits appreciated. Cranial nerves II through XII intact ?5/5 strength and sensation in all 4 extremities ?Other:   ? ? ?ED Results / Procedures / Treatments  ? ?Labs ?(all labs ordered are listed, but only  abnormal results are displayed) ?Labs Reviewed  ?BASIC METABOLIC PANEL - Abnormal; Notable for the following components:  ?    Result Value  ? Glucose, Bld 122 (*)   ? Calcium 8.7 (*)   ? All other components within normal limits  ?CBC  ?D-DIMER, QUANTITATIVE  ?TROPONIN I (HIGH SENSITIVITY)  ?TROPONIN I (HIGH SENSITIVITY)  ? ? ?EKG ?Sinus rhythm, rate of 88 bpm.  Normal axis and intervals.  No evidence of acute ischemia.  No comparison. ? ?RADIOLOGY ?CXR reviewed by me without evidence of acute cardiopulmonary pathology. ? ?Official radiology report(s): ?DG Chest 2 View ? ?Result Date: 04/30/2022 ?CLINICAL DATA:  Chest pain EXAM: CHEST - 2 VIEW COMPARISON:  None Available. FINDINGS: The heart size and mediastinal contours are within normal limits. Both lungs are clear. The visualized skeletal structures are unremarkable. IMPRESSION: Negative. Electronically Signed   By: Rolm Baptise M.D.   On: 04/30/2022 01:29   ? ?PROCEDURES and INTERVENTIONS: ? ?.1-3 Lead EKG Interpretation ?Performed by: Vladimir Crofts, MD ?Authorized by: Vladimir Crofts, MD  ? ?  Interpretation: normal   ?  ECG rate:  80 ?  ECG rate assessment: normal   ?  Rhythm: sinus rhythm   ?  Ectopy: none   ?  Conduction: normal   ? ?Medications - No data to display ? ? ?IMPRESSION / MDM / ASSESSMENT AND PLAN / ED COURSE  ?I reviewed the triage vital signs and the nursing notes. ? ?61 year old male without history of coronary disease presents to the ED with atypical chest pains and a benign work-up ultimately suitable for outpatient management.  Normal vitals and reassuring examination.  Has some reproducible chest pain on palpation without signs of trauma, skin changes, herpes zoster, neurologic or vascular deficits.  Work-up is benign with normal CBC, metabolic panel.  2 negative troponins and a negative D-dimer.  PE and ACS are very unlikely.  Pain is self resolving and he looks well.  CXR is clear without signs of PTX.  I considered observation admission for  this patient but considering such a reassuring work-up and resolving symptoms I think outpatient management is reasonable.  We discussed return precautions. ? ?  ? ? ?FINAL CLINICAL IMPRESSION(S) / ED DIAGNOSES  ? ?Final diagnoses:  ?Other chest pain  ? ? ? ?Rx / DC Orders  ? ?ED Discharge Orders   ? ? None  ? ?  ? ? ? ?Note:  This document was prepared using Dragon voice recognition software and may include unintentional dictation errors. ?  ?Vladimir Crofts, MD ?04/30/22 [phone] ? ?

## 2022-04-30 NOTE — ED Triage Notes (Signed)
Ambulatory to triage with c/o right side chest pain, radiating to shoulder blades. Onset apx 1 hour ago. Woke pt up from sleep, pain is sharp and constant. Denies SOB. Took '81mg'$  ASA before coming in to ED.  ?

## 2022-07-23 ENCOUNTER — Encounter: Payer: Self-pay | Admitting: *Deleted

## 2022-07-23 ENCOUNTER — Other Ambulatory Visit: Payer: Self-pay

## 2022-07-23 ENCOUNTER — Emergency Department: Payer: 59

## 2022-07-23 DIAGNOSIS — Z8616 Personal history of COVID-19: Secondary | ICD-10-CM | POA: Diagnosis not present

## 2022-07-23 DIAGNOSIS — R0789 Other chest pain: Principal | ICD-10-CM | POA: Insufficient documentation

## 2022-07-23 DIAGNOSIS — E109 Type 1 diabetes mellitus without complications: Secondary | ICD-10-CM | POA: Diagnosis not present

## 2022-07-23 DIAGNOSIS — I10 Essential (primary) hypertension: Secondary | ICD-10-CM | POA: Insufficient documentation

## 2022-07-23 DIAGNOSIS — Z794 Long term (current) use of insulin: Secondary | ICD-10-CM | POA: Diagnosis not present

## 2022-07-23 DIAGNOSIS — Z79899 Other long term (current) drug therapy: Secondary | ICD-10-CM | POA: Insufficient documentation

## 2022-07-23 NOTE — ED Triage Notes (Addendum)
Pt brought in via ems from home with chest pain. Pt also reports sob.  No n/v.  Sx began approx 45 minutes ago.   Etoh use today.   Pt alert  speech clear.

## 2022-07-24 ENCOUNTER — Other Ambulatory Visit: Payer: Self-pay

## 2022-07-24 ENCOUNTER — Observation Stay: Admit: 2022-07-24 | Payer: 59

## 2022-07-24 ENCOUNTER — Encounter: Payer: Self-pay | Admitting: Radiology

## 2022-07-24 ENCOUNTER — Emergency Department: Payer: 59

## 2022-07-24 ENCOUNTER — Observation Stay
Admission: EM | Admit: 2022-07-24 | Discharge: 2022-07-24 | Disposition: A | Discharge status: Home / Self Care | Payer: 59 | Attending: Internal Medicine | Admitting: Internal Medicine

## 2022-07-24 DIAGNOSIS — R0789 Other chest pain: Secondary | ICD-10-CM | POA: Diagnosis present

## 2022-07-24 DIAGNOSIS — E785 Hyperlipidemia, unspecified: Secondary | ICD-10-CM | POA: Diagnosis present

## 2022-07-24 DIAGNOSIS — K219 Gastro-esophageal reflux disease without esophagitis: Secondary | ICD-10-CM | POA: Diagnosis not present

## 2022-07-24 DIAGNOSIS — R079 Chest pain, unspecified: Secondary | ICD-10-CM

## 2022-07-24 DIAGNOSIS — I10 Essential (primary) hypertension: Secondary | ICD-10-CM | POA: Diagnosis present

## 2022-07-24 DIAGNOSIS — E109 Type 1 diabetes mellitus without complications: Secondary | ICD-10-CM | POA: Diagnosis present

## 2022-07-24 LAB — BASIC METABOLIC PANEL
Anion gap: 9 (ref 5–15)
BUN: 10 mg/dL (ref 8–23)
CO2: 22 mmol/L (ref 22–32)
Calcium: 8.5 mg/dL — ABNORMAL LOW (ref 8.9–10.3)
Chloride: 107 mmol/L (ref 98–111)
Creatinine, Ser: 1.01 mg/dL (ref 0.61–1.24)
GFR, Estimated: >60 mL/min (ref >60)
Glucose, Bld: 170 mg/dL — ABNORMAL HIGH (ref 70–99)
Potassium: 3.8 mmol/L (ref 3.5–5.1)
Sodium: 138 mmol/L (ref 135–145)

## 2022-07-24 LAB — CREATININE, SERUM
Creatinine, Ser: 0.87 mg/dL (ref 0.61–1.24)
GFR, Estimated: >60 mL/min (ref >60)

## 2022-07-24 LAB — HEPATIC FUNCTION PANEL
ALT: 26 U/L (ref 0–44)
AST: 22 U/L (ref 15–41)
Albumin: 3.9 g/dL (ref 3.5–5.0)
Alkaline Phosphatase: 71 U/L (ref 38–126)
Bilirubin, Direct: 0.1 mg/dL (ref 0.0–0.2)
Indirect Bilirubin: 0.4 mg/dL (ref 0.3–0.9)
Total Bilirubin: 0.5 mg/dL (ref 0.3–1.2)
Total Protein: 6.7 g/dL (ref 6.5–8.1)

## 2022-07-24 LAB — CBC
HCT: 42.4 % (ref 39.0–52.0)
Hemoglobin: 14.1 g/dL (ref 13.0–17.0)
MCH: 28 pg (ref 26.0–34.0)
MCHC: 33.3 g/dL (ref 30.0–36.0)
MCV: 84.3 fL (ref 80.0–100.0)
Platelets: 246 10*3/uL (ref 150–400)
RBC: 5.03 MIL/uL (ref 4.22–5.81)
RDW: 13 % (ref 11.5–15.5)
WBC: 7.3 10*3/uL (ref 4.0–10.5)
nRBC: 0 % (ref 0.0–0.2)

## 2022-07-24 LAB — TROPONIN I (HIGH SENSITIVITY)
Troponin I (High Sensitivity): 5 ng/L (ref <18)
Troponin I (High Sensitivity): 5 ng/L (ref <18)
Troponin I (High Sensitivity): 5 ng/L (ref <18)

## 2022-07-24 LAB — LIPASE, BLOOD: Lipase: 28 U/L (ref 11–51)

## 2022-07-24 LAB — HEMOGLOBIN A1C
Hgb A1c MFr Bld: 5.9 % — ABNORMAL HIGH (ref 4.8–5.6)
Mean Plasma Glucose: 122.63 mg/dL

## 2022-07-24 LAB — HIV ANTIBODY (ROUTINE TESTING W REFLEX): HIV Screen 4th Generation wRfx: NONREACTIVE

## 2022-07-24 LAB — CBG MONITORING, ED
Glucose-Capillary: 164 mg/dL — ABNORMAL HIGH (ref 70–99)
Glucose-Capillary: 155 mg/dL — ABNORMAL HIGH (ref 70–99)

## 2022-07-24 MED ORDER — INSULIN PUMP
SUBCUTANEOUS | Status: DC
  Filled 2022-07-24: qty 1

## 2022-07-24 MED ORDER — ASPIRIN 81 MG PO TBEC
81.0000 mg | DELAYED_RELEASE_TABLET | Freq: Every day | ORAL | 12 refills | Status: AC
Start: 2022-07-25 — End: ?

## 2022-07-24 MED ORDER — INSULIN ASPART 100 UNIT/ML IJ SOLN: 0.0000 [IU] | INTRAMUSCULAR | Status: DC

## 2022-07-24 MED ORDER — ALUM & MAG HYDROXIDE-SIMETH 200-200-20 MG/5ML PO SUSP
30.0000 mL | Freq: Once | ORAL | Status: AC
  Administered 2022-07-24: 30 mL via ORAL
  Filled 2022-07-24: qty 30

## 2022-07-24 MED ORDER — ASPIRIN 81 MG PO CHEW
324.0000 mg | CHEWABLE_TABLET | Freq: Once | ORAL | Status: AC
  Administered 2022-07-24: 324 mg via ORAL
  Filled 2022-07-24: qty 4

## 2022-07-24 MED ORDER — ONDANSETRON HCL 4 MG/2ML IJ SOLN: 4.0000 mg | Freq: Four times a day (QID) | INTRAMUSCULAR | Status: DC | PRN

## 2022-07-24 MED ORDER — INSULIN PUMP
Freq: Three times a day (TID) | SUBCUTANEOUS | Status: DC
  Filled 2022-07-24: qty 1

## 2022-07-24 MED ORDER — ENOXAPARIN SODIUM 40 MG/0.4ML IJ SOSY: 40.0000 mg | PREFILLED_SYRINGE | INTRAMUSCULAR | Status: DC

## 2022-07-24 MED ORDER — NITROGLYCERIN 0.4 MG SL SUBL
0.4000 mg | SUBLINGUAL_TABLET | SUBLINGUAL | Status: DC | PRN
  Filled 2022-07-24: qty 1

## 2022-07-24 MED ORDER — MORPHINE SULFATE (PF) 4 MG/ML IV SOLN
4.0000 mg | Freq: Once | INTRAVENOUS | Status: AC
  Administered 2022-07-24: 4 mg via INTRAVENOUS
  Filled 2022-07-24: qty 1

## 2022-07-24 MED ORDER — LOSARTAN POTASSIUM 50 MG PO TABS
50.0000 mg | ORAL_TABLET | Freq: Every day | ORAL | Status: DC
  Administered 2022-07-24: 50 mg via ORAL
  Filled 2022-07-24: qty 1

## 2022-07-24 MED ORDER — MORPHINE SULFATE (PF) 4 MG/ML IV SOLN: 4.0000 mg | Freq: Once | INTRAVENOUS | Status: DC

## 2022-07-24 MED ORDER — IOHEXOL 350 MG/ML SOLN
100.0000 mL | Freq: Once | INTRAVENOUS | Status: AC | PRN
  Administered 2022-07-24: 100 mL via INTRAVENOUS

## 2022-07-24 MED ORDER — ENOXAPARIN SODIUM 60 MG/0.6ML IJ SOSY
0.5000 mg/kg | PREFILLED_SYRINGE | INTRAMUSCULAR | Status: DC
  Administered 2022-07-24: 60 mg via SUBCUTANEOUS
  Filled 2022-07-24: qty 0.6

## 2022-07-24 MED ORDER — ONDANSETRON HCL 4 MG/2ML IJ SOLN
4.0000 mg | INTRAMUSCULAR | Status: AC
  Administered 2022-07-24: 4 mg via INTRAVENOUS
  Filled 2022-07-24: qty 2

## 2022-07-24 MED ORDER — PANTOPRAZOLE SODIUM 40 MG PO TBEC
40.0000 mg | DELAYED_RELEASE_TABLET | Freq: Every day | ORAL | Status: DC
  Administered 2022-07-24: 40 mg via ORAL
  Filled 2022-07-24: qty 1

## 2022-07-24 MED ORDER — SODIUM CHLORIDE 0.9 % IV SOLN: INTRAVENOUS | Status: AC

## 2022-07-24 MED ORDER — PRAVASTATIN SODIUM 20 MG PO TABS: 20.0000 mg | ORAL_TABLET | Freq: Every day | ORAL | Status: DC

## 2022-07-24 MED ORDER — ASPIRIN 81 MG PO TBEC: 81.0000 mg | DELAYED_RELEASE_TABLET | Freq: Every day | ORAL | Status: DC

## 2022-07-24 MED ORDER — PRAVASTATIN SODIUM 20 MG PO TABS: 20.0000 mg | ORAL_TABLET | Freq: Every day | ORAL | 2 refills | Status: AC

## 2022-07-24 MED ORDER — ACETAMINOPHEN 325 MG PO TABS
650.0000 mg | ORAL_TABLET | ORAL | Status: DC | PRN
  Administered 2022-07-24: 650 mg via ORAL
  Filled 2022-07-24: qty 2

## 2022-07-24 NOTE — ED Notes (Signed)
Dc ppw reviewd. Pt followup and rx info given. Pt provides verbal consent for dc and is ambulatory off unit on foot.

## 2022-07-24 NOTE — Consult Note (Signed)
Jack Edwards is a 61 y.o. male  [id number]  Primary Cardiologist: Neoma Laming Reason for Consultation: Chest pain  HPI: 61 year old white male with a past medical history of insulin requiring diabetes hypertension hyperlipidemia presented to the hospital with chest pain which is when he takes deep breath it gets worse and is also associated with change in position.  He still has intermittent chest pain which is described as pressure type.   Review of Systems: No cough orthopnea PND or leg swelling   Past Medical History:  Diagnosis Date   COVID-19 11/28/2020   GERD (gastroesophageal reflux disease)    Hyperlipidemia    Hypertension    Type 1 diabetes (HCC)     (Not in a hospital admission)     [START ON 07/25/2022] aspirin EC  81 mg Oral Daily   enoxaparin (LOVENOX) injection  0.5 mg/kg Subcutaneous Q24H   insulin pump   Subcutaneous TID WC, HS, 0200   losartan  50 mg Oral Daily   pantoprazole  40 mg Oral Daily   pravastatin  20 mg Oral Daily    Infusions:  sodium chloride 100 mL/hr at 07/24/22 1884    Allergies  Allergen Reactions   Fish Allergy Nausea And Vomiting    (OK with shellfish)   Lisinopril Cough    Patient states he is not allergic to Lisinopril    Social History   Socioeconomic History   Marital status: Married    Spouse name: Not on file   Number of children: Not on file   Years of education: Not on file   Highest education level: Not on file  Occupational History   Not on file  Tobacco Use   Smoking status: Never   Smokeless tobacco: Never  Vaping Use   Vaping Use: Never used  Substance and Sexual Activity   Alcohol use: Yes    Alcohol/week: 5.0 standard drinks of alcohol    Types: 5 Cans of beer per week   Drug use: Never   Sexual activity: Yes  Other Topics Concern   Not on file  Social History Narrative   Not on file   Social Determinants of Health   Financial Resource Strain: Not on file  Food Insecurity: Not on  file  Transportation Needs: Not on file  Physical Activity: Not on file  Stress: Not on file  Social Connections: Not on file  Intimate Partner Violence: Not on file    No family history on file.  PHYSICAL EXAM: Vitals:   07/24/22 0730 07/24/22 0736  BP: (!) 140/82   Pulse: 95   Resp: 17   Temp:  98.4 F (36.9 C)  SpO2: 96%     No intake or output data in the 24 hours ending 07/24/22 0921  General:  Well appearing. No respiratory difficulty HEENT: normal Neck: supple. no JVD. Carotids 2+ bilat; no bruits. No lymphadenopathy or thryomegaly appreciated. Cor: PMI nondisplaced. Regular rate & rhythm. No rubs, gallops or murmurs. Lungs: clear Abdomen: soft, nontender, nondistended. No hepatosplenomegaly. No bruits or masses. Good bowel sounds. Extremities: no cyanosis, clubbing, rash, edema Neuro: alert & oriented x 3, cranial nerves grossly intact. moves all 4 extremities w/o difficulty. Affect pleasant.  ECG: Normal sinus rhythm with minimal half millimeter ST elevation in lead III  Results for orders placed or performed during the hospital encounter of 07/24/22 (from the past 24 hour(s))  Lipase, blood     Status: None   Collection Time: 07/23/22 11:47 PM  Result Value Ref Range   Lipase 28 11 - 51 U/L  Hepatic function panel     Status: None   Collection Time: 07/23/22 11:47 PM  Result Value Ref Range   Total Protein 6.7 6.5 - 8.1 g/dL   Albumin 3.9 3.5 - 5.0 g/dL   AST 22 15 - 41 U/L   ALT 26 0 - 44 U/L   Alkaline Phosphatase 71 38 - 126 U/L   Total Bilirubin 0.5 0.3 - 1.2 mg/dL   Bilirubin, Direct 0.1 0.0 - 0.2 mg/dL   Indirect Bilirubin 0.4 0.3 - 0.9 mg/dL  Basic metabolic panel     Status: Abnormal   Collection Time: 07/23/22 11:50 PM  Result Value Ref Range   Sodium 138 135 - 145 mmol/L   Potassium 3.8 3.5 - 5.1 mmol/L   Chloride 107 98 - 111 mmol/L   CO2 22 22 - 32 mmol/L   Glucose, Bld 170 (H) 70 - 99 mg/dL   BUN 10 8 - 23 mg/dL   Creatinine, Ser 1.01  0.61 - 1.24 mg/dL   Calcium 8.5 (L) 8.9 - 10.3 mg/dL   GFR, Estimated >60 >60 mL/min   Anion gap 9 5 - 15  CBC     Status: None   Collection Time: 07/23/22 11:50 PM  Result Value Ref Range   WBC 7.3 4.0 - 10.5 K/uL   RBC 5.03 4.22 - 5.81 MIL/uL   Hemoglobin 14.1 13.0 - 17.0 g/dL   HCT 42.4 39.0 - 52.0 %   MCV 84.3 80.0 - 100.0 fL   MCH 28.0 26.0 - 34.0 pg   MCHC 33.3 30.0 - 36.0 g/dL   RDW 13.0 11.5 - 15.5 %   Platelets 246 150 - 400 K/uL   nRBC 0.0 0.0 - 0.2 %  Troponin I (High Sensitivity)     Status: None   Collection Time: 07/23/22 11:50 PM  Result Value Ref Range   Troponin I (High Sensitivity) 5 <18 ng/L  Troponin I (High Sensitivity)     Status: None   Collection Time: 07/24/22  3:15 AM  Result Value Ref Range   Troponin I (High Sensitivity) 5 <18 ng/L  Hemoglobin A1c     Status: Abnormal   Collection Time: 07/24/22  6:30 AM  Result Value Ref Range   Hgb A1c MFr Bld 5.9 (H) 4.8 - 5.6 %   Mean Plasma Glucose 122.63 mg/dL  Creatinine, serum     Status: None   Collection Time: 07/24/22  6:30 AM  Result Value Ref Range   Creatinine, Ser 0.87 0.61 - 1.24 mg/dL   GFR, Estimated >60 >60 mL/min  CBG monitoring, ED     Status: Abnormal   Collection Time: 07/24/22  7:27 AM  Result Value Ref Range   Glucose-Capillary 164 (H) 70 - 99 mg/dL   CT Angio Chest/Abd/Pel for Dissection W and/or W/WO  Result Date: 07/24/2022 CLINICAL DATA:  Chest pain and shortness of breath. EXAM: CT ANGIOGRAPHY CHEST, ABDOMEN AND PELVIS TECHNIQUE: Non-contrast CT of the chest was initially obtained. Multidetector CT imaging through the chest, abdomen and pelvis was performed using the standard protocol during bolus administration of intravenous contrast. Multiplanar reconstructed images and MIPs were obtained and reviewed to evaluate the vascular anatomy. RADIATION DOSE REDUCTION: This exam was performed according to the departmental dose-optimization program which includes automated exposure control,  adjustment of the mA and/or kV according to patient size and/or use of iterative reconstruction technique. CONTRAST:  188m OMNIPAQUE IOHEXOL 350  MG/ML SOLN COMPARISON:  None Available. FINDINGS: CTA CHEST FINDINGS Cardiovascular: Preferential opacification of the thoracic aorta. No evidence of thoracic aortic aneurysm or dissection. Normal heart size. No pericardial effusion. Negative pulmonary arteries Mediastinum/Nodes: No mass or adenopathy Lungs/Pleura: There is no edema, consolidation, effusion, or pneumothorax. Musculoskeletal: No acute or aggressive finding. Remote T9 compression fracture. Review of the MIP images confirms the above findings. CTA ABDOMEN AND PELVIS FINDINGS VASCULAR Aorta: Normal caliber aorta without aneurysm, dissection, vasculitis or significant stenosis. Celiac: Unremarkable SMA: Unremarkable Renals: Unremarkable IMA: Unremarkable Inflow: Unremarkable Veins: Unremarkable Review of the MIP images confirms the above findings. NON-VASCULAR Hepatobiliary: No focal liver abnormality.No evidence of biliary obstruction or stone. Pancreas: Unremarkable. Spleen: Unremarkable. Adrenals/Urinary Tract: Negative adrenals. No hydronephrosis or stone. Unremarkable bladder. Stomach/Bowel:  No obstruction. No appendicitis. Lymphatic: No mass or adenopathy. Reproductive:No pathologic findings. Other: No ascites or pneumoperitoneum. Musculoskeletal: No acute abnormalities. L5-S1 disc space narrowing and ridging. Remote L2 compression fracture. Review of the MIP images confirms the above findings. IMPRESSION: Negative CTA.  No acute aortic syndrome. Electronically Signed   By: Jorje Guild M.D.   On: 07/24/2022 04:52   DG Chest 2 View  Result Date: 07/24/2022 CLINICAL DATA:  Chest pain EXAM: CHEST - 2 VIEW COMPARISON:  04/30/2022 FINDINGS: The heart size and mediastinal contours are within normal limits. Both lungs are clear. The visualized skeletal structures are unremarkable. IMPRESSION: No active  cardiopulmonary disease. Electronically Signed   By: Rolm Baptise M.D.   On: 07/24/2022 00:23     ASSESSMENT AND PLAN: Atypical chest pain mostly pleuritic with borderline changes on EKG with half millimeter ST elevation in lead III not very specific.  No evidence of STEMI and no evidence of non-STEMI.  Will observe the patient and decide as to whether do outpatient work-up or inpatient.  At this time he seems pleuritic chest pain if MI is ruled out may discharge the patient with follow-up in the office Monday at 9 AM and 2 stress tests in the office.  Will look at the echocardiogram for wall motion abnormality.  Continue current medication.  Tyquisha Sharps A

## 2022-07-24 NOTE — ED Provider Notes (Signed)
[facility] Provider Note    Event Date/Time   First MD Initiated Contact with Patient 07/24/22 0240     (approximate)   History   Chest Pain   HPI  Jack Edwards is a 61 y.o. male with history of type 1 diabetes but no known heart disease or history of blood clots in the legs of the lungs.  He presents for evaluation of chest pain.  The symptoms started about an hour prior to arrival.  He was brought by EMS.  He said that the pain is in the upper central part of his chest and it makes it hard for him to take a deep breath.  He has had no nausea or vomiting or sweating.  No abdominal pain.  He admits to drinking 5 or 6 beers earlier today but he does not feel like he is having any acid reflux or aspiration episodes.  He is clinically sober at the time of my evaluation.  He has acid reflux and he takes medication for it and he has been compliant with his medications.  He has an insulin pump which is working appropriately.  In addition to 2 diabetes, he also has hyperlipidemia and high blood pressure.  He does not use tobacco.  He has no numbness nor tingling in his extremities.     Physical Exam   Triage Vital Signs: ED Triage Vitals  Enc Vitals Group     BP 07/23/22 2349 127/73     Pulse Rate 07/23/22 2349 89     Resp 07/23/22 2349 20     Temp 07/23/22 2349 98.4 F (36.9 C)     Temp Source 07/23/22 2349 Oral     SpO2 07/23/22 2349 98 %     Weight 07/23/22 2345 120.2 kg (265 lb)     Height 07/23/22 2345 1.93 m ('6\' 4"'$ )     Head Circumference --      Peak Flow --      Pain Score 07/23/22 2346 8     Pain Loc --      Pain Edu? --      Excl. in [place]? --     Most recent vital signs: Vitals:   07/24/22 0316 07/24/22 0415  BP: (!) 165/84 (!) 145/90  Pulse: 97 100  Resp: 19 (!) 21  Temp: 98.2 F (36.8 C)   SpO2: 99% 98%     General: Awake, appears uncomfortable but not in severe distress. CV:  Good peripheral perfusion.  Normal heart sounds.   Borderline tachycardia, regular rhythm. Resp:  Normal effort but pain with deep inspiration.  Lungs are clear to auscultation bilaterally. Abd:  No distention.  No tenderness to palpation the abdomen.  No abdominal bruit or pulsatile mass. Other:  No focal neurological deficits.  Mood and affect are normal under the circumstances.   ED Results / Procedures / Treatments   Labs (all labs ordered are listed, but only abnormal results are displayed) Labs Reviewed  BASIC METABOLIC PANEL - Abnormal; Notable for the following components:      Result Value   Glucose, Bld 170 (*)    Calcium 8.5 (*)    All other components within normal limits  CBC  LIPASE, BLOOD  HEPATIC FUNCTION PANEL  TROPONIN I (HIGH SENSITIVITY)  TROPONIN I (HIGH SENSITIVITY)     EKG  ED ECG REPORT #1 I, Hinda Kehr, the attending physician, personally viewed and interpreted this ECG.  Date: 07/23/2022 EKG Time: 23:  45 Rate: 88 Rhythm: normal sinus rhythm QRS Axis: normal Intervals: normal ST/T Wave abnormalities: normal Narrative Interpretation: no evidence of acute ischemia  ED ECG REPORT #2 I, Hinda Kehr, the attending physician, personally viewed and interpreted this ECG.  Date: 07/24/2022 EKG Time: 4:05 AM Rate: 103 Rhythm: Sinus tachycardia QRS Axis: normal Intervals: normal ST/T Wave abnormalities: Perhaps 1 mm of ST segment elevation in leads V5 and V6 but without significant reciprocal changes  narrative Interpretation: Suggestive of ischemia but not consistent with acute STEMI.   RADIOLOGY I viewed and interpreted the patient's two-view chest x-ray, and there is no evidence of pneumonia or widened mediastinum or pneumothorax.  I also read the radiologist's report, which confirmed no acute findings.  I also viewed and interpreted the patient's CT angio chest/abdomen/pelvis.  I did not identify any acute abnormalities and the radiologist did not either.    PROCEDURES:  Critical Care  performed: No  .1-3 Lead EKG Interpretation  Performed by: Hinda Kehr, MD Authorized by: Hinda Kehr, MD     Interpretation: abnormal     ECG rate:  100   ECG rate assessment: tachycardic     Rhythm: sinus tachycardia     Ectopy: none     Conduction: normal      MEDICATIONS ORDERED IN ED: Medications  morphine (PF) 4 MG/ML injection 4 mg (has no administration in time range)  morphine (PF) 4 MG/ML injection 4 mg (has no administration in time range)  alum & mag hydroxide-simeth (MAALOX/MYLANTA) 200-200-20 MG/5ML suspension 30 mL (30 mLs Oral Given 07/24/22 0315)  morphine (PF) 4 MG/ML injection 4 mg (4 mg Intravenous Given 07/24/22 0406)  ondansetron (ZOFRAN) injection 4 mg (4 mg Intravenous Given 07/24/22 0405)  iohexol (OMNIPAQUE) 350 MG/ML injection 100 mL (100 mLs Intravenous Contrast Given 07/24/22 0431)  aspirin chewable tablet 324 mg (324 mg Oral Given 07/24/22 0544)     IMPRESSION / MDM / ASSESSMENT AND PLAN / ED COURSE  I reviewed the triage vital signs and the nursing notes.                              Differential diagnosis includes, but is not limited to, acid reflux/gastritis, pancreatitis, biliary colic, ACS, AAS, pneumonia, pneumothorax.  Patient's presentation is most consistent with acute presentation with potential threat to life or bodily function.  The patient is on the cardiac monitor to evaluate for evidence of arrhythmia and/or significant heart rate changes.  Vital signs are essentially normal other than some very mild tachypnea.  No hypoxemia.  Physical exam is reassuring.  EKG has some nonspecific changes but no evidence of ischemia.  Labs ordered include high-sensitivity troponin x2, CBC, lipase, hepatic function panel, basic metabolic panel.  Labs are all within normal limits and even his glucose is essentially normal for a type I diabetic at 170.  Negative high-sensitivity troponin.  Normal chest x-ray.  I suspect the patient may be having some  reflux from drinking alcohol.  I suggested we repeat the troponin as planned and I ordered 30 mL of Maalox to see if this helps relieve his symptoms.  We will then reassess.  AAS is possible, but I think unlikely given the whole constellation of symptoms and no significant hypertension or tachycardia that would be more suggestive of aortic dissection.  However I will consider advanced imaging if he continues to feel worse.   Clinical Course as of 07/24/22 0601  Sat Jul 24, 2022  0402 Patient is having worsening pain in spite of taking the GI cocktail (Mylanta).  His blood pressure is going up even though it is not extremely high just about 403J systolic.  However at this point I am concerned about the possibility of AAS.  He is now saying that it feels like it is going through to his back.  I ordered morphine 4 mg IV, Zofran 4 mg IV, repeat EKG, and CTA chest/abdomen/pelvis to rule out AAS. [CF]  [id number] I reassessed the patient.  He was sleeping about awaken easily.  He said he is still having the chest pain, slightly better than before he got the morphine, but it is still present in the center of his chest, radiating somewhat to the back, and worse when he takes deep breaths.  At this point I explained that I do not have a good explanation for his symptoms, but given his ongoing chest pain, the comorbidities of diabetes, hypertension, and hyperlipidemia, I feel that it is most appropriate to admit him to the hospital for further cardiac evaluation and observation.  He agrees with the plan and feels uncomfortable going home due to the ongoing pain.  I will give him a full dose aspirin but hold off on heparin until speaking with the hospitalist.  I am consulting the hospitalist for admission. [CF]  0601 Discussed case by phone with Dr. Damita Dunnings with the hospitalist service who will admit the patient for high risk chest pain. [CF]    Clinical Course User Index [CF] Hinda Kehr, MD     FINAL CLINICAL  IMPRESSION(S) / ED DIAGNOSES   Final diagnoses:  Chest pain, unspecified type     Rx / DC Orders   ED Discharge Orders     None        Note:  This document was prepared using Dragon voice recognition software and may include unintentional dictation errors.   Hinda Kehr, MD 07/24/22 [phone]

## 2022-07-24 NOTE — Assessment & Plan Note (Signed)
Stable Continue statins

## 2022-07-24 NOTE — H&P (Signed)
History and Physical    Patient: Jack Edwards GEX:[id number] DOB: 25-Sep-1961 DOA: 07/24/2022 DOS: the patient was seen and examined on 07/24/2022 PCP: Kirk Ruths, MD  Patient coming from: Home  Chief Complaint:  Chief Complaint  Patient presents with   Chest Pain   HPI: Jack Edwards is a 61 y.o. male with medical history significant for type 1 diabetes mellitus on an insulin pump, obesity, GERD, hypertension, dyslipidemia who presents to the ER for evaluation of chest pain which started about 10 PM the night prior to his admission.  Pain is mostly midsternal, rated 10 x 10 to intensity at its worst and radiates to both shoulders and up to his neck.  He states the pain is worse with inspiration but denies having any cough, no fever, no nausea, no vomiting, no palpitations or diaphoresis. He has a history of reflux but states that this does not feel like his prior episodes. He denies having any abdominal pain, no changes in his bowel habits, no dizziness, no lightheadedness, no urinary symptoms, no blurred vision no focal deficit. He will be referred to observation status for chest pain evaluation.    Review of Systems: As mentioned in the history of present illness. All other systems reviewed and are negative. Past Medical History:  Diagnosis Date   COVID-19 11/28/2020   GERD (gastroesophageal reflux disease)    Hyperlipidemia    Hypertension    Type 1 diabetes ([place])    Past Surgical History:  Procedure Laterality Date   COLONOSCOPY     DENTAL SURGERY     NASAL TURBINATE REDUCTION Bilateral 01/22/2022   Procedure: TURBINATE REDUCTION/SUBMUCOSAL RESECTION;  Surgeon: Beverly Gust, MD;  Location: [facility];  Service: ENT;  Laterality: Bilateral;  Diabetic   SHOULDER SURGERY Left    Social History:  reports that he has never smoked. He has never used smokeless tobacco. He reports current alcohol use of about 5.0 standard drinks of alcohol per week. He  reports that he does not use drugs.  Allergies  Allergen Reactions   Fish Allergy Nausea And Vomiting    (OK with shellfish)   Lisinopril Cough    Patient states he is not allergic to Lisinopril    No family history on file.  Prior to Admission medications   Medication Sig Start Date End Date Taking? Authorizing Provider  Ascorbic Acid (VITAMIN C PO) Take by mouth daily.    [provider]  HYDROcodone-Acetaminophen 5-300 MG TABS Take 1-2 tablets by mouth every 4 (four) hours as needed. 01/22/22   Beverly Gust, MD  INSULIN ASPART IJ Inject as directed. In insulin pump    [provider]  losartan (COZAAR) 50 MG tablet Take 50 mg by mouth daily.    [provider]  Multiple Vitamins-Minerals (ZINC PO) Take by mouth daily.    [provider]  omeprazole (PRILOSEC) 40 MG capsule Take 40 mg by mouth daily.    [provider]  pravastatin (PRAVACHOL) 20 MG tablet Take 20 mg by mouth daily.    [provider]  sulfamethoxazole-trimethoprim (BACTRIM DS) 800-160 MG tablet Take 1 tablet by mouth 2 (two) times daily. 01/22/22   Beverly Gust, MD  Thiamine HCl (VITAMIN B-1 PO) Take by mouth daily.    [provider]  VITAMIN D PO Take by mouth daily.    [provider]    Physical Exam: Vitals:   07/24/22 0545 07/24/22 0600 07/24/22 0730 07/24/22 0736  BP: (!) 144/88 Marland Kitchen)  143/83 (!) 140/82   Pulse: (!) 103 (!) 116 95   Resp: 18 (!) 25 17   Temp:    98.4 F (36.9 C)  TempSrc:    Oral  SpO2: 97% 99% 96%   Weight:      Height:       Physical Exam Vitals and nursing note reviewed.  Constitutional:      Appearance: He is well-developed.  HENT:     Head: Normocephalic and atraumatic.  Eyes:     Pupils: Pupils are equal, round, and reactive to light.  Cardiovascular:     Rate and Rhythm: Normal rate and regular rhythm.     Heart sounds: Normal heart sounds.  Pulmonary:     Effort: Pulmonary effort is normal.      Breath sounds: Normal breath sounds.  Chest:     Comments: Nonreproducible chest pain  Abdominal:     Palpations: Abdomen is soft.  Musculoskeletal:        General: Normal range of motion.     Cervical back: Normal range of motion.  Skin:    General: Skin is warm and dry.  Neurological:     General: No focal deficit present.     Mental Status: He is alert.  Psychiatric:        Mood and Affect: Mood normal.        Behavior: Behavior normal.     Data Reviewed: Relevant notes from primary care and specialist visits, past discharge summaries as available in EHR, including Care Everywhere. Prior diagnostic testing as pertinent to current admission diagnoses Updated medications and problem lists for reconciliation ED course, including vitals, labs, imaging, treatment and response to treatment Triage notes, nursing and pharmacy notes and ED provider's notes Notable results as noted in HPI Labs reviewed.  Troponin 5, lipase 28, total protein 6.7, albumin 3.9, AST 22, ALT 26, alkaline phosphatase 71, total bilirubin 0.5, sodium 138, potassium 3.8, chloride 107, bicarb 22, glucose 170, BUN 10, creatinine 1.01, calcium 8.5, white count 7.3, hemoglobin 14.1, hematocrit 42.4, platelet count 246 Chest x-ray shows no evidence of acute cardio pulmonary disease Twelve-lead EKG reviewed shows normal sinus rhythm.  Inferior infarct age undetermined. There are no new results to review at this time.  Assessment and Plan: * Chest pain Patient presents to the ER for evaluation of midsternal chest pain with radiation to both shoulders and neck. He continues to have pain but improved from admission 2 sets of cardiac enzymes are negative Consult cardiology for further recommendations Obtain 2D echocardiogram to assess LVEF and rule out regional wall motion abnormality Continue aspirin, Lovenox and statin  GERD (gastroesophageal reflux disease) Stable Continue  PPI  Hyperlipidemia Stable Continue statins  Hypertension Blood pressure is stable Continue losartan  Type 1 diabetes (HCC) Maintain consistent carbohydrate diet Continue insulin pump per protocol Check blood sugars with meals and HS      Advance Care Planning:   Code Status: Full Code   Consults: Cardiology  Family Communication: Greater than 50% of time was spent discussing patient's condition and plan of care with him at the bedside.  All questions and concerns have been addressed.  He verbalizes understanding and agrees with the plan.  Severity of Illness: The appropriate patient status for this patient is OBSERVATION. Observation status is judged to be reasonable and necessary in order to provide the required intensity of service to ensure the patient's safety. The patient's presenting symptoms, physical exam findings, and initial radiographic and laboratory data  in the context of their medical condition is felt to place them at decreased risk for further clinical deterioration. Furthermore, it is anticipated that the patient will be medically stable for discharge from the hospital within 2 midnights of admission.   Author: Collier Bullock, MD 07/24/2022 8:53 AM  For on call review [email or web address].

## 2022-07-24 NOTE — Progress Notes (Signed)
Anticoagulation monitoring(Lovenox):  61 yo male ordered Lovenox 40 mg Q24h    Filed Weights   07/23/22 2345  Weight: 120.2 kg (265 lb)   BMI  32.3   Lab Results  Component Value Date   CREATININE 1.01 07/23/2022   CREATININE 1.03 04/30/2022   Estimated Creatinine Clearance: 108.9 mL/min (by C-G formula based on SCr of 1.01 mg/dL). Hemoglobin & Hematocrit     Component Value Date/Time   HGB 14.1 07/23/2022 2350   HCT 42.4 07/23/2022 2350     Per Protocol for Patient with estCrcl > 30 ml/min and BMI > 30, will transition to Lovenox 60 mg Q24h.

## 2022-07-24 NOTE — Assessment & Plan Note (Signed)
Stable.  Continue PPI. 

## 2022-07-24 NOTE — Discharge Instructions (Signed)
Follow-up with Dr Humphrey Rolls on 08/ 07/23 @ 9am Check blood sugars with meals Return to the ER for evaluation of worsening symptoms

## 2022-07-24 NOTE — Discharge Summary (Signed)
Physician Discharge Summary   Patient: Jack Edwards MRN: [id number] DOB: November 30, 1961  Admit date:     07/24/2022  Discharge date: 07/24/22  Discharge Physician: Arthella Headings   PCP: Kirk Ruths, MD   Recommendations at discharge:   Keep scheduled follow-up appointment with cardiologist, Dr Humphrey Rolls on 07/26/22 at [place]  Discharge Diagnoses: Principal Problem:   Atypical chest pain Active Problems:   Type 1 diabetes ([place])   Hypertension   Hyperlipidemia   GERD (gastroesophageal reflux disease)  Resolved Problems:   * No resolved hospital problems. *  Hospital Course: No notes on file Jack Edwards is a 61 y.o. male with medical history significant for type 1 diabetes mellitus on an insulin pump, obesity, GERD, hypertension, dyslipidemia who presents to the ER for evaluation of chest pain which started about 10 PM the night prior to his admission.  Pain is mostly midsternal, rated 10 x 10 in intensity at its worst and radiated to both shoulders and up to his neck.  He stated the pain is worse with inspiration but denied having any cough, no fever, no nausea, no vomiting, no palpitations or diaphoresis. He has a history of reflux but stated that this does not feel like his prior episodes. He denied having any abdominal pain, no changes in his bowel habits, no dizziness, no lightheadedness, no urinary symptoms, no blurred vision no focal deficit. He was referred to observation status for chest pain evaluation. Assessment and Plan: Patient ruled out for an acute coronary syndrome and was seen and evaluated by cardiology who recommends that patient may be discharged home to follow-up with him as an outpatient for a stress test. Patient was seen and examined at bedside and is in stable condition for discharge.  See vital signs below       Consultants: Dr Humphrey Rolls Procedures performed: None  Disposition: Home Diet recommendation:  Discharge Diet Orders (From admission,  onward)     Start     Ordered   07/24/22 0000  Diet - low sodium heart healthy        07/24/22 1421   07/24/22 0000  Diet Carb Modified        07/24/22 1421           Carb modified diet DISCHARGE MEDICATION: Allergies as of 07/24/2022       Reactions   Fish Allergy Nausea And Vomiting   (OK with shellfish)   Lisinopril Cough   Patient states he is not allergic to Lisinopril        Medication List     STOP taking these medications    HYDROcodone-Acetaminophen 5-300 MG Tabs   sulfamethoxazole-trimethoprim 800-160 MG tablet Commonly known as: BACTRIM DS   ZINC PO       TAKE these medications    aspirin EC 81 MG tablet Take 1 tablet (81 mg total) by mouth daily. Swallow whole. Start taking on: July 25, 2022   INSULIN ASPART IJ Inject as directed. In insulin pump   losartan 50 MG tablet Commonly known as: COZAAR Take 50 mg by mouth daily.   omeprazole 40 MG capsule Commonly known as: PRILOSEC Take 40 mg by mouth daily.   pravastatin 20 MG tablet Commonly known as: PRAVACHOL Take 1 tablet (20 mg total) by mouth daily.   VITAMIN B-1 PO Take by mouth daily.   VITAMIN C PO Take by mouth daily.   VITAMIN D PO Take 1 tablet by mouth daily.  Discharge Exam: Filed Weights   07/23/22 2345  Weight: 120.2 kg   Vitals and nursing note reviewed.  Constitutional:      Appearance: He is well-developed.  HENT:     Head: Normocephalic and atraumatic.  Eyes:     Pupils: Pupils are equal, round, and reactive to light.  Cardiovascular:     Rate and Rhythm: Normal rate and regular rhythm.     Heart sounds: Normal heart sounds.  Pulmonary:     Effort: Pulmonary effort is normal.     Breath sounds: Normal breath sounds.  Chest:     Comments: Nonreproducible chest pain   Abdominal:     Palpations: Abdomen is soft.  Musculoskeletal:        General: Normal range of motion.     Cervical back: Normal range of motion.  Skin:    General: Skin is  warm and dry.  Neurological:     General: No focal deficit present.     Mental Status: He is alert.  Psychiatric:        Mood and Affect: Mood normal.        Behavior: Behavior normal.   Condition at discharge: stable  The results of significant diagnostics from this hospitalization (including imaging, microbiology, ancillary and laboratory) are listed below for reference.   Imaging Studies: CT Angio Chest/Abd/Pel for Dissection W and/or W/WO  Result Date: 07/24/2022 CLINICAL DATA:  Chest pain and shortness of breath. EXAM: CT ANGIOGRAPHY CHEST, ABDOMEN AND PELVIS TECHNIQUE: Non-contrast CT of the chest was initially obtained. Multidetector CT imaging through the chest, abdomen and pelvis was performed using the standard protocol during bolus administration of intravenous contrast. Multiplanar reconstructed images and MIPs were obtained and reviewed to evaluate the vascular anatomy. RADIATION DOSE REDUCTION: This exam was performed according to the departmental dose-optimization program which includes automated exposure control, adjustment of the mA and/or kV according to patient size and/or use of iterative reconstruction technique. CONTRAST:  121m OMNIPAQUE IOHEXOL 350 MG/ML SOLN COMPARISON:  None Available. FINDINGS: CTA CHEST FINDINGS Cardiovascular: Preferential opacification of the thoracic aorta. No evidence of thoracic aortic aneurysm or dissection. Normal heart size. No pericardial effusion. Negative pulmonary arteries Mediastinum/Nodes: No mass or adenopathy Lungs/Pleura: There is no edema, consolidation, effusion, or pneumothorax. Musculoskeletal: No acute or aggressive finding. Remote T9 compression fracture. Review of the MIP images confirms the above findings. CTA ABDOMEN AND PELVIS FINDINGS VASCULAR Aorta: Normal caliber aorta without aneurysm, dissection, vasculitis or significant stenosis. Celiac: Unremarkable SMA: Unremarkable Renals: Unremarkable IMA: Unremarkable Inflow:  Unremarkable Veins: Unremarkable Review of the MIP images confirms the above findings. NON-VASCULAR Hepatobiliary: No focal liver abnormality.No evidence of biliary obstruction or stone. Pancreas: Unremarkable. Spleen: Unremarkable. Adrenals/Urinary Tract: Negative adrenals. No hydronephrosis or stone. Unremarkable bladder. Stomach/Bowel:  No obstruction. No appendicitis. Lymphatic: No mass or adenopathy. Reproductive:No pathologic findings. Other: No ascites or pneumoperitoneum. Musculoskeletal: No acute abnormalities. L5-S1 disc space narrowing and ridging. Remote L2 compression fracture. Review of the MIP images confirms the above findings. IMPRESSION: Negative CTA.  No acute aortic syndrome. Electronically Signed   By: JJorje GuildM.D.   On: 07/24/2022 04:52   DG Chest 2 View  Result Date: 07/24/2022 CLINICAL DATA:  Chest pain EXAM: CHEST - 2 VIEW COMPARISON:  04/30/2022 FINDINGS: The heart size and mediastinal contours are within normal limits. Both lungs are clear. The visualized skeletal structures are unremarkable. IMPRESSION: No active cardiopulmonary disease. Electronically Signed   By: KRolm BaptiseM.D.   On: 07/24/2022 00:23  Microbiology: No results found for this or any previous visit.  Labs: CBC: Recent Labs  Lab 07/23/22 2350  WBC 7.3  HGB 14.1  HCT 42.4  MCV 84.3  PLT 225   Basic Metabolic Panel: Recent Labs  Lab 07/23/22 2350 07/24/22 0630  NA 138  --   K 3.8  --   CL 107  --   CO2 22  --   GLUCOSE 170*  --   BUN 10  --   CREATININE 1.01 0.87  CALCIUM 8.5*  --    Liver Function Tests: Recent Labs  Lab 07/23/22 2347  AST 22  ALT 26  ALKPHOS 71  BILITOT 0.5  PROT 6.7  ALBUMIN 3.9   CBG: Recent Labs  Lab 07/24/22 0727 07/24/22 1117  GLUCAP 164* 155*    Discharge time spent: greater than 30 minutes.  Signed: Collier Bullock, MD [facility] 07/24/2022

## 2022-07-24 NOTE — ED Notes (Signed)
Breakfast tray at bedside 

## 2022-07-24 NOTE — Assessment & Plan Note (Signed)
Patient presents to the ER for evaluation of midsternal chest pain with radiation to both shoulders and neck. He continues to have pain but improved from admission 2 sets of cardiac enzymes are negative Consult cardiology for further recommendations Obtain 2D echocardiogram to assess LVEF and rule out regional wall motion abnormality Continue aspirin, Lovenox and statin

## 2022-07-24 NOTE — ED Notes (Signed)
Informed RN bed assigned 

## 2022-07-24 NOTE — ED Notes (Signed)
Pt ambulated to room from stretcher, pt endorsing increased pain with movement.

## 2022-07-24 NOTE — ED Notes (Signed)
MD Damita Dunnings responded to page stating that patient is able to keep his home insulin monitor and pump in use. RN also to make pt aware that he is NPO pending cardiology evaluation.

## 2022-07-24 NOTE — Assessment & Plan Note (Signed)
Blood pressure is stable Continue losartan

## 2022-07-24 NOTE — Assessment & Plan Note (Signed)
Maintain consistent carbohydrate diet Continue insulin pump per protocol Check blood sugars with meals and HS

## 2022-08-12 ENCOUNTER — Ambulatory Visit: Payer: 59 | Admitting: Dermatology

## 2022-08-12 DIAGNOSIS — L570 Actinic keratosis: Secondary | ICD-10-CM

## 2022-08-12 DIAGNOSIS — H61003 Unspecified perichondritis of external ear, bilateral: Secondary | ICD-10-CM | POA: Diagnosis not present

## 2022-08-12 DIAGNOSIS — D1800 Hemangioma unspecified site: Secondary | ICD-10-CM

## 2022-08-12 DIAGNOSIS — L578 Other skin changes due to chronic exposure to nonionizing radiation: Secondary | ICD-10-CM | POA: Diagnosis not present

## 2022-08-12 DIAGNOSIS — Z1283 Encounter for screening for malignant neoplasm of skin: Secondary | ICD-10-CM | POA: Diagnosis not present

## 2022-08-12 DIAGNOSIS — L821 Other seborrheic keratosis: Secondary | ICD-10-CM

## 2022-08-12 DIAGNOSIS — D229 Melanocytic nevi, unspecified: Secondary | ICD-10-CM | POA: Diagnosis not present

## 2022-08-12 DIAGNOSIS — L814 Other melanin hyperpigmentation: Secondary | ICD-10-CM

## 2022-08-12 NOTE — Progress Notes (Signed)
Follow-Up Visit   Subjective  Jack Edwards is a 61 y.o. male who presents for the following: Annual Exam. Hx of precancers.  The patient presents for Total-Body Skin Exam (TBSE) for skin cancer screening and mole check.  The patient has spots, moles and lesions to be evaluated, some may be new or changing and the patient has concerns that these could be cancer.   The following portions of the chart were reviewed this encounter and updated as appropriate:   Tobacco  Allergies  Meds  Problems  Med Hx  Surg Hx  Fam Hx     Review of Systems:  No other skin or systemic complaints except as noted in HPI or Assessment and Plan.  Objective  Well appearing patient in no apparent distress; mood and affect are within normal limits.  A full examination was performed including scalp, head, eyes, ears, nose, lips, neck, chest, axillae, abdomen, back, buttocks, bilateral upper extremities, bilateral lower extremities, hands, feet, fingers, toes, fingernails, and toenails. All findings within normal limits unless otherwise noted below.  Right Ear Erythematous thin papules/macules with gritty scale.    Assessment & Plan  AK (actinic keratosis) Right Ear  Actinic keratoses are precancerous spots that appear secondary to cumulative UV radiation exposure/sun exposure over time. They are chronic with expected duration over 1 year. A portion of actinic keratoses will progress to squamous cell carcinoma of the skin. It is not possible to reliably predict which spots will progress to skin cancer and so treatment is recommended to prevent development of skin cancer.  Recommend daily broad spectrum sunscreen SPF 30+ to sun-exposed areas, reapply every 2 hours as needed.  Recommend staying in the shade or wearing long sleeves, sun glasses (UVA+UVB protection) and wide brim hats (4-inch brim around the entire circumference of the hat). Call for new or changing lesions.   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  Trauma or pressure from sleeping on the ear or from cell phone use and sun damage may be exacerbating factors.  Treatment may include using a C-shaped airplane neck pillow for sleeping on the side of the head so no pressure is on the ear.  Other treatments include topical or intralesional steroids; liquid nitrogen or laser destruction; shave removal or excision.  The condition can be difficult to treat and persist or recur despite treatment.  Lentigines - Scattered tan macules - Due to sun exposure - Benign-appearing, observe - Recommend daily broad spectrum sunscreen SPF 30+ to sun-exposed areas, reapply every 2 hours as needed. - Call for any changes  Seborrheic Keratoses - Stuck-on, waxy, tan-brown papules and/or plaques  - Benign-appearing - Discussed benign etiology and prognosis. - Observe - Call for any changes  Melanocytic Nevi - Tan-brown and/or pink-flesh-colored symmetric macules and papules - Benign appearing on exam today - Observation - Call clinic for new or changing moles - Recommend daily use of  broad spectrum spf 30+ sunscreen to sun-exposed areas.   Hemangiomas - Red papules - Discussed benign nature - Observe - Call for any changes  Actinic Damage - Chronic condition, secondary to cumulative UV/sun exposure - diffuse  scaly erythematous macules with underlying dyspigmentation - Recommend daily broad spectrum sunscreen SPF 30+ to sun-exposed areas, reapply every 2 hours as needed.  - Staying in the shade or wearing long sleeves, sun glasses (UVA+UVB protection) and wide brim hats (4-inch brim around the entire circumference of the hat) are also recommended for sun protection.  - Call for new or changing lesions.  Skin cancer screening performed today.   Return in about 1 year (around 08/13/2023) for TBSE, hx of AKs . IMarye Round, CMA, am acting as scribe for Sarina Ser, MD .  Documentation: I have reviewed the above documentation for accuracy and completeness, and I agree with the above.  Sarina Ser, MD

## 2022-08-12 NOTE — Patient Instructions (Addendum)
Cryotherapy Aftercare  Wash gently with soap and water everyday.   Apply Vaseline and Band-Aid daily until healed.     Due to recent changes in healthcare laws, you may see results of your pathology and/or laboratory studies on MyChart before the doctors have had a chance to review them. We understand that in some cases there may be results that are confusing or concerning to you. Please understand that not all results are received at the same time and often the doctors may need to interpret multiple results in order to provide you with the best plan of care or course of treatment. Therefore, we ask that you please give us 2 business days to thoroughly review all your results before contacting the office for clarification. Should we see a critical lab result, you will be contacted sooner.   If You Need Anything After Your Visit  If you have any questions or concerns for your doctor, please call our main line at [phone] and press option 4 to reach your doctor's medical assistant. If no one answers, please leave a voicemail as directed and we will return your call as soon as possible. Messages left after 4 pm will be answered the following business day.   You may also send us a message via MyChart. We typically respond to MyChart messages within 1-2 business days.  For prescription refills, please ask your pharmacy to contact our office. Our fax number is [phone].  If you have an urgent issue when the clinic is closed that cannot wait until the next business day, you can page your doctor at the number below.    Please note that while we do our best to be available for urgent issues outside of office hours, we are not available 24/7.   If you have an urgent issue and are unable to reach us, you may choose to seek medical care at your doctor's office, retail clinic, urgent care center, or emergency room.  If you have a medical emergency, please immediately call 911 or go to the  emergency department.  Pager Numbers  - Dr. Kowalski: [phone]  - Dr. Moye: [phone]  - Dr. Stewart: [phone]  In the event of inclement weather, please call our main line at [phone] for an update on the status of any delays or closures.  Dermatology Medication Tips: Please keep the boxes that topical medications come in in order to help keep track of the instructions about where and how to use these. Pharmacies typically print the medication instructions only on the boxes and not directly on the medication tubes.   If your medication is too expensive, please contact our office at [phone] option 4 or send us a message through MyChart.   We are unable to tell what your co-pay for medications will be in advance as this is different depending on your insurance coverage. However, we may be able to find a substitute medication at lower cost or fill out paperwork to get insurance to cover a needed medication.   If a prior authorization is required to get your medication covered by your insurance company, please allow us 1-2 business days to complete this process.  Drug prices often vary depending on where the prescription is filled and some pharmacies may offer cheaper prices.  The website www.goodrx.com contains coupons for medications through different pharmacies. The prices here do not account for what the cost may be with help from insurance (it may be cheaper with your insurance), but the website can   give you the price if you did not use any insurance.  - You can print the associated coupon and take it with your prescription to the pharmacy.  - You may also stop by our office during regular business hours and pick up a GoodRx coupon card.  - If you need your prescription sent electronically to a different pharmacy, notify our office through [facility] MyChart or by phone at [phone] option 4.     Si Usted Necesita Algo Despus de Su Visita  Tambin puede  enviarnos un mensaje a travs de MyChart. Por lo general respondemos a los mensajes de MyChart en el transcurso de 1 a 2 das hbiles.  Para renovar recetas, por favor pida a su farmacia que se ponga en contacto con nuestra oficina. Nuestro nmero de fax es el [phone].  Si tiene un asunto urgente cuando la clnica est cerrada y que no puede esperar hasta el siguiente da hbil, puede llamar/localizar a su doctor(a) al nmero que aparece a continuacin.   Por favor, tenga en cuenta que aunque hacemos todo lo posible para estar disponibles para asuntos urgentes fuera del horario de oficina, no estamos disponibles las 24 horas del da, los 7 das de la semana.   Si tiene un problema urgente y no puede comunicarse con nosotros, puede optar por buscar atencin mdica  en el consultorio de su doctor(a), en una clnica privada, en un centro de atencin urgente o en una sala de emergencias.  Si tiene una emergencia mdica, por favor llame inmediatamente al 911 o vaya a la sala de emergencias.  Nmeros de bper  - Dr. Kowalski: [phone]  - Dra. Moye: [phone]  - Dra. Stewart: [phone]  En caso de inclemencias del tiempo, por favor llame a nuestra lnea principal al [phone] para una actualizacin sobre el estado de cualquier retraso o cierre.  Consejos para la medicacin en dermatologa: Por favor, guarde las cajas en las que vienen los medicamentos de uso tpico para ayudarle a seguir las instrucciones sobre dnde y cmo usarlos. Las farmacias generalmente imprimen las instrucciones del medicamento slo en las cajas y no directamente en los tubos del medicamento.   Si su medicamento es muy caro, por favor, pngase en contacto con nuestra oficina llamando al [phone] y presione la opcin 4 o envenos un mensaje a travs de MyChart.   No podemos decirle cul ser su copago por los medicamentos por adelantado ya que esto es diferente dependiendo de la cobertura de su seguro.  Sin embargo, es posible que podamos encontrar un medicamento sustituto a menor costo o llenar un formulario para que el seguro cubra el medicamento que se considera necesario.   Si se requiere una autorizacin previa para que su compaa de seguros cubra su medicamento, por favor permtanos de 1 a 2 das hbiles para completar este proceso.  Los precios de los medicamentos varan con frecuencia dependiendo del lugar de dnde se surte la receta y alguna farmacias pueden ofrecer precios ms baratos.  El sitio web www.goodrx.com tiene cupones para medicamentos de diferentes farmacias. Los precios aqu no tienen en cuenta lo que podra costar con la ayuda del seguro (puede ser ms barato con su seguro), pero el sitio web puede darle el precio si no utiliz ningn seguro.  - Puede imprimir el cupn correspondiente y llevarlo con su receta a la farmacia.  - Tambin puede pasar por nuestra oficina durante el horario de atencin regular y recoger una tarjeta de cupones de GoodRx.  -   Si necesita que su receta se enve electrnicamente a una farmacia diferente, informe a nuestra oficina a travs de MyChart de [facility] o por telfono llamando al [phone] y presione la opcin 4.  

## 2022-08-14 ENCOUNTER — Encounter: Payer: Self-pay | Admitting: Dermatology

## 2023-01-28 ENCOUNTER — Other Ambulatory Visit: Payer: 59

## 2023-01-28 ENCOUNTER — Ambulatory Visit (INDEPENDENT_AMBULATORY_CARE_PROVIDER_SITE_OTHER): Payer: 59

## 2023-01-28 ENCOUNTER — Ambulatory Visit
Admission: EM | Admit: 2023-01-28 | Discharge: 2023-01-28 | Disposition: A | Discharge status: Home / Self Care | Payer: 59

## 2023-01-28 DIAGNOSIS — M25521 Pain in right elbow: Secondary | ICD-10-CM | POA: Diagnosis not present

## 2023-01-28 DIAGNOSIS — Z23 Encounter for immunization: Secondary | ICD-10-CM

## 2023-01-28 DIAGNOSIS — S51011A Laceration without foreign body of right elbow, initial encounter: Secondary | ICD-10-CM | POA: Diagnosis not present

## 2023-01-28 MED ORDER — CEPHALEXIN 500 MG PO CAPS: 500.0000 mg | ORAL_CAPSULE | Freq: Four times a day (QID) | ORAL | 0 refills | Status: AC

## 2023-01-28 MED ORDER — TETANUS-DIPHTH-ACELL PERTUSSIS 5-2.5-18.5 LF-MCG/0.5 IM SUSY
0.5000 mL | PREFILLED_SYRINGE | Freq: Once | INTRAMUSCULAR | Status: AC
  Administered 2023-01-28: 0.5 mL via INTRAMUSCULAR

## 2023-01-28 NOTE — Discharge Instructions (Addendum)
Your tetanus was updated today.    Take the cephalexin as directed.  Return here in 7-10 days for removal of your stitches.    Keep your wound clean and dry.  Wash it gently twice a day with soap and water.    Follow up right away if you see signs of infection, such as increased pain, redness, pus-like drainage, warmth, fever, chills, or other concerning symptoms.

## 2023-01-28 NOTE — ED Triage Notes (Signed)
Patient to Urgent Care with complaints of laceration present to right elbow after an injury that occurred today. Reports cutting his arm on a sharp piece of metal when he was pulling a piece of wood.

## 2023-01-28 NOTE — ED Provider Notes (Signed)
[facility]    CSN: [id number] Arrival date & time: 01/28/23  1140      History   Chief Complaint Chief Complaint  Patient presents with   Laceration    HPI Jack Edwards is a 62 y.o. male.  Patient presents with pain and laceration of his right elbow x 30 minutes.  He was pulling on a piece of wood and when it came loose, his elbow struck a sharp piece of metal behind him.  Bleeding controlled with bandage.  He reports pain in his elbow when he moves his hand.  No numbness, weakness, or other symptoms.  His medical history includes Type 1 diabetes, hypertension, GERD.  Last tetanus unknown.    The history is provided by the patient and medical records.    Past Medical History:  Diagnosis Date   COVID-19 11/28/2020   GERD (gastroesophageal reflux disease)    Hyperlipidemia    Hypertension    Type 1 diabetes [facility])     Patient Active Problem List   Diagnosis Date Noted   Atypical chest pain 07/24/2022   Type 1 diabetes ([place])    Hypertension    Hyperlipidemia    GERD (gastroesophageal reflux disease)     Past Surgical History:  Procedure Laterality Date   COLONOSCOPY     DENTAL SURGERY     NASAL TURBINATE REDUCTION Bilateral 01/22/2022   Procedure: TURBINATE REDUCTION/SUBMUCOSAL RESECTION;  Surgeon: Beverly Gust, MD;  Location: [facility];  Service: ENT;  Laterality: Bilateral;  Diabetic   SHOULDER SURGERY Left        Home Medications    Prior to Admission medications   Medication Sig Start Date End Date Taking? Authorizing Provider  cephALEXin (KEFLEX) 500 MG capsule Take 1 capsule (500 mg total) by mouth 4 (four) times daily. 01/28/23  Yes Sharion Balloon, NP  Continuous Blood Gluc Sensor (DEXCOM G6 SENSOR) MISC USE TO MONITOR BLOOD SUGAR. REPLACE EVERY 10 DAYS. [place] [id number]. 11/10/22  Yes [provider]  Ascorbic Acid (VITAMIN C PO) Take by mouth daily.    [provider]  aspirin EC 81 MG tablet Take 1 tablet  (81 mg total) by mouth daily. Swallow whole. 07/25/22   Collier Bullock, MD  INSULIN ASPART IJ Inject as directed. In insulin pump    [provider]  losartan (COZAAR) 50 MG tablet Take 50 mg by mouth daily.    [provider]  omeprazole (PRILOSEC) 40 MG capsule Take 40 mg by mouth daily.    [provider]  pravastatin (PRAVACHOL) 20 MG tablet Take 1 tablet (20 mg total) by mouth daily. 07/24/22   Collier Bullock, MD  Thiamine HCl (VITAMIN B-1 PO) Take by mouth daily.    [provider]  VITAMIN D PO Take 1 tablet by mouth daily.    [provider]    Family History History reviewed. No pertinent family history.  Social History Social History   Tobacco Use   Smoking status: Never   Smokeless tobacco: Never  Vaping Use   Vaping Use: Never used  Substance Use Topics   Alcohol use: Yes    Alcohol/week: 5.0 standard drinks of alcohol    Types: 5 Cans of beer per week   Drug use: Never     Allergies   Fish allergy and Lisinopril   Review of Systems Review of Systems  Musculoskeletal:  Positive for arthralgias. Negative for joint swelling.  Skin:  Positive for wound. Negative for color  change.  Neurological:  Negative for weakness and numbness.  All other systems reviewed and are negative.    Physical Exam Triage Vital Signs ED Triage Vitals  Enc Vitals Group     BP      Pulse      Resp      Temp      Temp src      SpO2      Weight      Height      Head Circumference      Peak Flow      Pain Score      Pain Loc      Pain Edu?      Excl. in [place]?    No data found.  Updated Vital Signs BP 137/82   Pulse 97   Temp 97.6 F (36.4 C)   Resp 18   SpO2 97%   Visual Acuity Right Eye Distance:   Left Eye Distance:   Bilateral Distance:    Right Eye Near:   Left Eye Near:    Bilateral Near:     Physical Exam Vitals and nursing note reviewed.  Constitutional:      General: He is not in acute distress.     Appearance: Normal appearance. He is well-developed. He is not ill-appearing.  HENT:     Mouth/Throat:     Mouth: Mucous membranes are moist.  Cardiovascular:     Rate and Rhythm: Normal rate and regular rhythm.     Heart sounds: Normal heart sounds.  Pulmonary:     Effort: Pulmonary effort is normal. No respiratory distress.     Breath sounds: Normal breath sounds.  Musculoskeletal:        General: Tenderness present. No swelling or deformity. Normal range of motion.     Cervical back: Neck supple.  Skin:    General: Skin is warm and dry.     Capillary Refill: Capillary refill takes less than 2 seconds.     Findings: Lesion present.     Comments: 2 cm u-shaped laceration on right elbow.    Neurological:     General: No focal deficit present.     Mental Status: He is alert and oriented to person, place, and time.     Sensory: No sensory deficit.     Motor: No weakness.  Psychiatric:        Mood and Affect: Mood normal.        Behavior: Behavior normal.      UC Treatments / Results  Labs (all labs ordered are listed, but only abnormal results are displayed) Labs Reviewed - No data to display  EKG   Radiology DG Elbow Complete Right  Result Date: 01/28/2023 CLINICAL DATA:  Right elbow laceration after injury that occurred today. Cut arm on sharp piece of metal when pulling a piece of wood. EXAM: RIGHT ELBOW - COMPLETE 3+ VIEW COMPARISON:  None Available. FINDINGS: Normal bone mineralization. Minimal degenerative spurring of the tip of the coronoid process. Mild chronic enthesopathic change at the common extensor tendon origin at the lateral epicondyle of the distal humerus. Linear lucency consistent with posterior elbow laceration. No radiopaque foreign body is seen. No acute fracture or dislocation. IMPRESSION: Posterior elbow laceration. No acute fracture or radiopaque foreign body. Electronically Signed   By: Yvonne Kendall M.D.   On: 01/28/2023 12:13     Procedures Laceration Repair  Date/Time: 01/28/2023 11:59 AM  Performed by: Sharion Balloon, NP Authorized by: Hall Busing,  Fredderick Phenix, NP   Consent:    Consent obtained:  Verbal   Consent given by:  Patient   Risks discussed:  Infection, pain, poor cosmetic result and poor wound healing Universal protocol:    Procedure explained and questions answered to patient or proxy's satisfaction: yes   Laceration details:    Location:  Shoulder/arm   Shoulder/arm location:  R elbow   Length (cm):  2   Depth (mm):  3 Pre-procedure details:    Preparation:  Patient was prepped and draped in usual sterile fashion Exploration:    Hemostasis achieved with:  Direct pressure   Imaging outcome: foreign body not noted     Wound exploration: wound explored through full range of motion and entire depth of wound visualized   Treatment:    Area cleansed with:  Shur-Clens   Amount of cleaning:  Standard   Irrigation solution:  Sterile saline   Irrigation method:  Syringe   Visualized foreign bodies/material removed: no   Skin repair:    Repair method:  Sutures   Suture size:  4-0   Suture material:  Nylon   Suture technique:  Simple interrupted   Number of sutures:  3 Approximation:    Approximation:  Close Repair type:    Repair type:  Simple Post-procedure details:    Dressing:  Antibiotic ointment and non-adherent dressing   Procedure completion:  Tolerated well, no immediate complications  (including critical care time)  Medications Ordered in UC Medications  Tdap (BOOSTRIX) injection 0.5 mL (0.5 mLs Intramuscular Given 01/28/23 1208)    Initial Impression / Assessment and Plan / UC Course  I have reviewed the triage vital signs and the nursing notes.  Pertinent labs & imaging results that were available during my care of the patient were reviewed by me and considered in my medical decision making (see chart for details).    Right elbow pain, laceration of right elbow.  3 sutures.  Tetanus  updated.  Xray of elbow negative.  Patient is type I diabetic.  Treating today with cephalexin.  Wound care instructions and signs of infection discussed with patient.  Instructed him to return here in 7 to 10 days for suture removal.  Instructed him to return right away if he notes signs of infection.  Education provided on laceration care.  Patient agrees to plan of care.  Final Clinical Impressions(s) / UC Diagnoses   Final diagnoses:  Right elbow pain  Laceration of right elbow, initial encounter     Discharge Instructions      Your tetanus was updated today.    Take the cephalexin as directed.  Return here in 7-10 days for removal of your stitches.    Keep your wound clean and dry.  Wash it gently twice a day with soap and water.    Follow up right away if you see signs of infection, such as increased pain, redness, pus-like drainage, warmth, fever, chills, or other concerning symptoms.        ED Prescriptions     Medication Sig Dispense Auth. Provider   cephALEXin (KEFLEX) 500 MG capsule Take 1 capsule (500 mg total) by mouth 4 (four) times daily. 28 capsule Sharion Balloon, NP      PDMP not reviewed this encounter.   Sharion Balloon, NP 01/28/23 1240

## 2023-02-04 ENCOUNTER — Ambulatory Visit
Admission: EM | Admit: 2023-02-04 | Discharge: 2023-02-04 | Disposition: A | Discharge status: Home / Self Care | Payer: 59

## 2023-02-04 DIAGNOSIS — S51011D Laceration without foreign body of right elbow, subsequent encounter: Secondary | ICD-10-CM

## 2023-02-04 NOTE — ED Triage Notes (Signed)
Patient presents to UC for suture removal. Three sutures placed to right elbow area 02/09. Instructed to return day 7-10 for removal. No s/s of infection. No concerns reported at visit.

## 2023-05-29 IMAGING — CR DG CHEST 2V
1 series · 2 of 2 positions shown · non-contrast
Comparison: None Available.

CLINICAL DATA: Chest pain

EXAM:
CHEST - 2 VIEW

[Series 1: dg chest 2 view · 0.14mm/px · 2 of 2 slices shown]
[im 1/2]
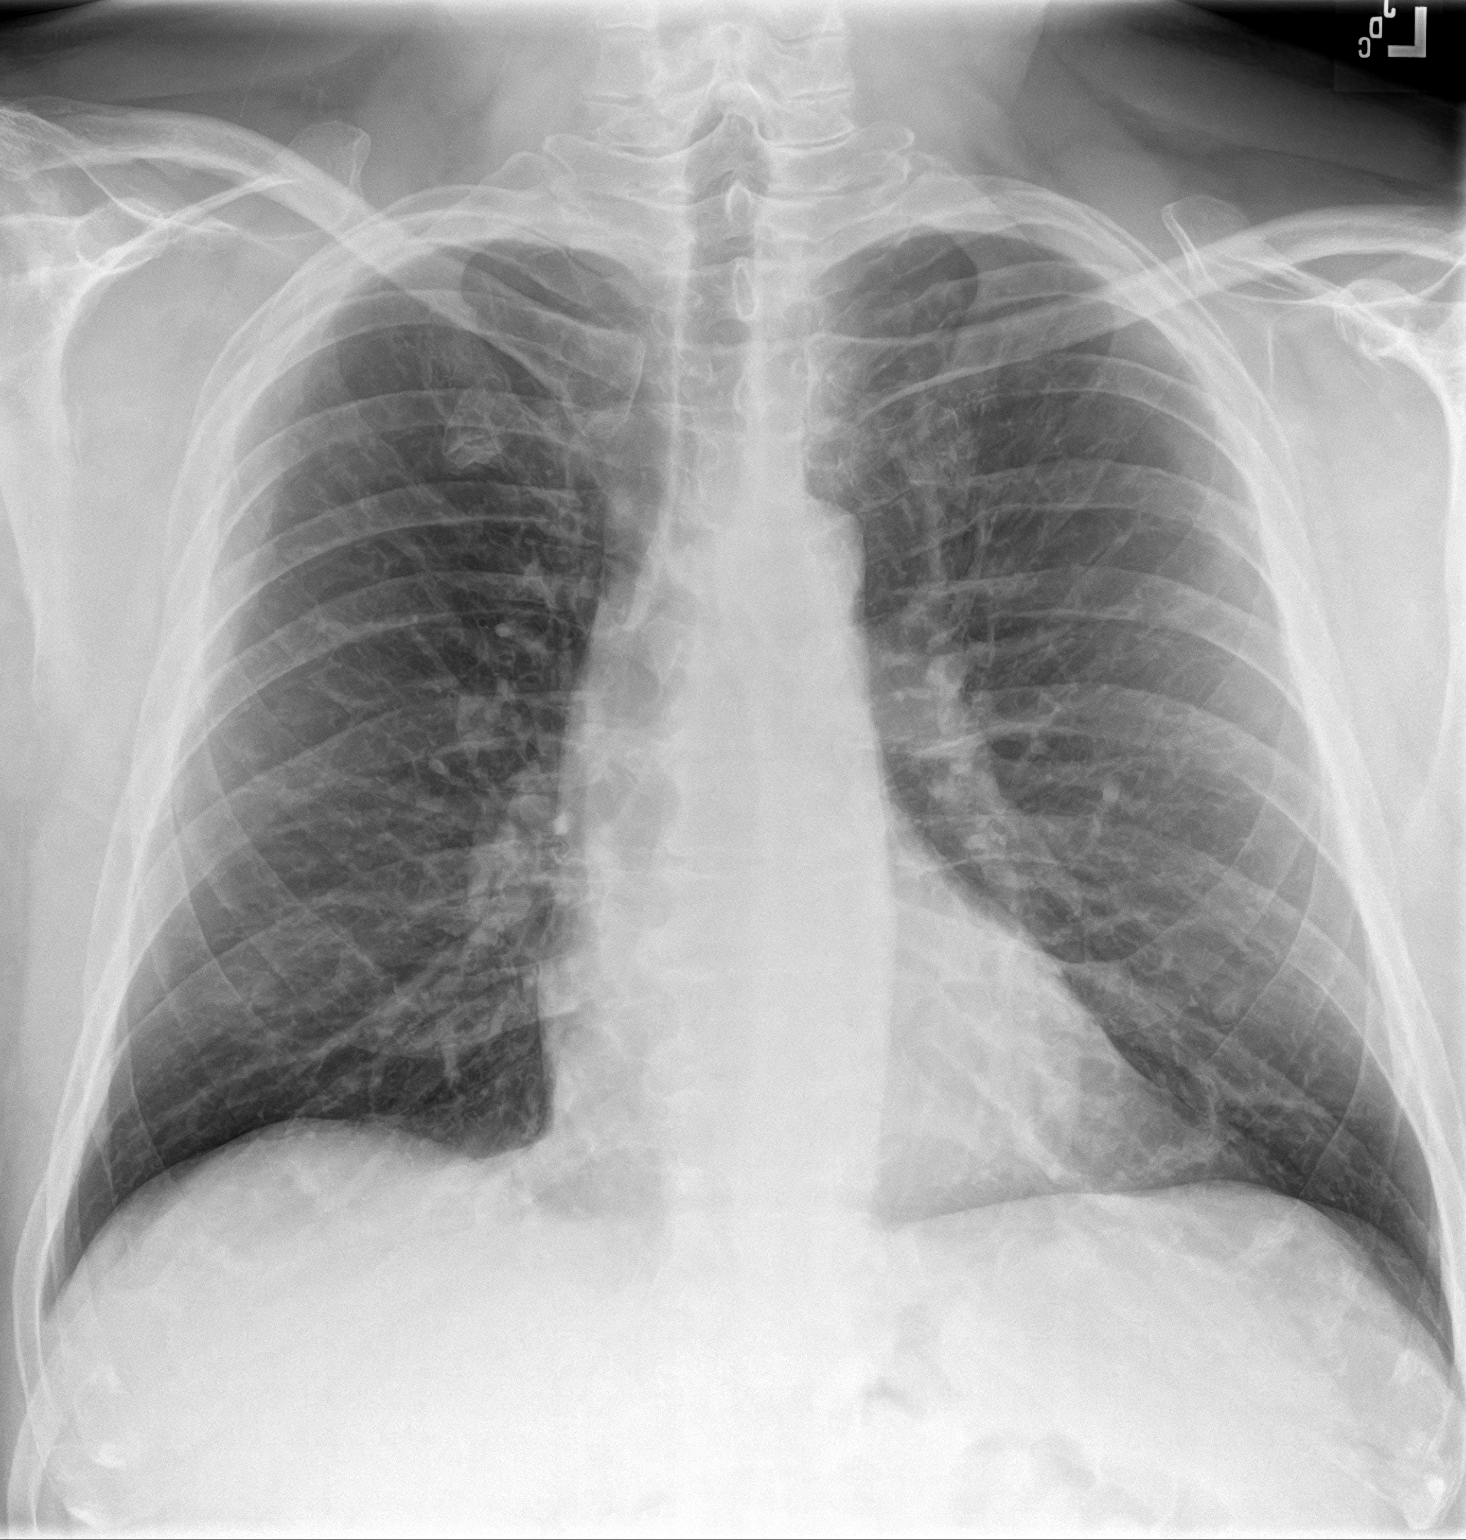
[im 2/2]
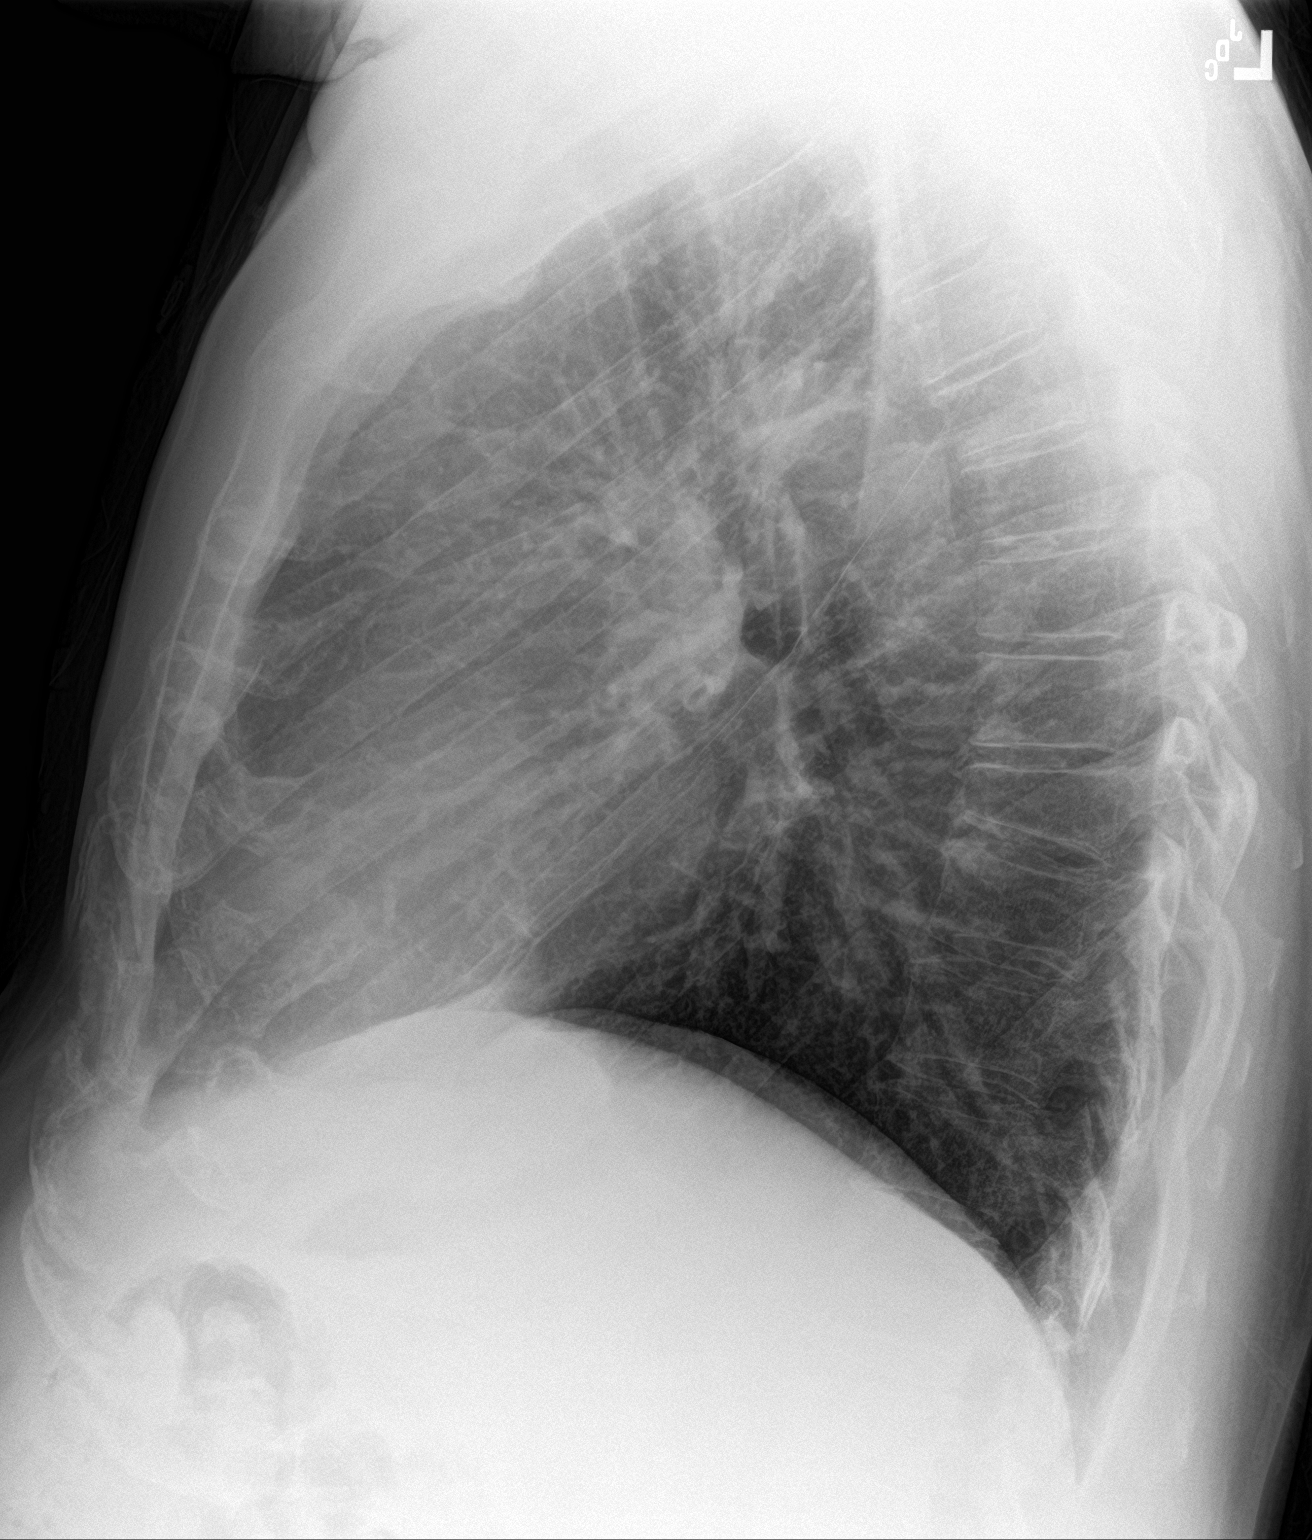

[2 of 2 positions shown; findings below may reference images not displayed]

FINDINGS: The heart size and mediastinal contours are within normal limits.
Both lungs are clear. The visualized skeletal structures are
unremarkable.
IMPRESSION: Negative.

## 2023-08-31 ENCOUNTER — Ambulatory Visit: Payer: 59 | Admitting: Dermatology

## 2023-08-31 DIAGNOSIS — D2371 Other benign neoplasm of skin of right lower limb, including hip: Secondary | ICD-10-CM

## 2023-08-31 DIAGNOSIS — W908XXA Exposure to other nonionizing radiation, initial encounter: Secondary | ICD-10-CM

## 2023-08-31 DIAGNOSIS — Z1283 Encounter for screening for malignant neoplasm of skin: Secondary | ICD-10-CM | POA: Diagnosis not present

## 2023-08-31 DIAGNOSIS — L578 Other skin changes due to chronic exposure to nonionizing radiation: Secondary | ICD-10-CM

## 2023-08-31 DIAGNOSIS — L570 Actinic keratosis: Secondary | ICD-10-CM | POA: Diagnosis not present

## 2023-08-31 DIAGNOSIS — L821 Other seborrheic keratosis: Secondary | ICD-10-CM

## 2023-08-31 DIAGNOSIS — L820 Inflamed seborrheic keratosis: Secondary | ICD-10-CM

## 2023-08-31 DIAGNOSIS — Z872 Personal history of diseases of the skin and subcutaneous tissue: Secondary | ICD-10-CM

## 2023-08-31 DIAGNOSIS — D239 Other benign neoplasm of skin, unspecified: Secondary | ICD-10-CM

## 2023-08-31 DIAGNOSIS — L814 Other melanin hyperpigmentation: Secondary | ICD-10-CM

## 2023-08-31 DIAGNOSIS — D1801 Hemangioma of skin and subcutaneous tissue: Secondary | ICD-10-CM

## 2023-08-31 DIAGNOSIS — H61009 Unspecified perichondritis of external ear, unspecified ear: Secondary | ICD-10-CM

## 2023-08-31 DIAGNOSIS — D229 Melanocytic nevi, unspecified: Secondary | ICD-10-CM

## 2023-08-31 NOTE — Progress Notes (Signed)
Follow-Up Visit   Subjective  Jack Edwards is a 62 y.o. male who presents for the following: Skin Cancer Screening and Full Body Skin Exam, hx of precancers.   The patient presents for Total-Body Skin Exam (TBSE) for skin cancer screening and mole check. The patient has spots, moles and lesions to be evaluated, some may be new or changing and the patient may have concern these could be cancer.    The following portions of the chart were reviewed this encounter and updated as appropriate: medications, allergies, medical history  Review of Systems:  No other skin or systemic complaints except as noted in HPI or Assessment and Plan.  Objective  Well appearing patient in no apparent distress; mood and affect are within normal limits.  A full examination was performed including scalp, head, eyes, ears, nose, lips, neck, chest, axillae, abdomen, back, buttocks, bilateral upper extremities, bilateral lower extremities, hands, feet, fingers, toes, fingernails, and toenails. All findings within normal limits unless otherwise noted below.   Relevant physical exam findings are noted in the Assessment and Plan.  left ear x 2, right ear x 1, scalp x 1 (4) Erythematous thin papules/macules with gritty scale.   left lower eyelid margin Stuck-on, waxy, tan-brown papule--Discussed benign etiology and prognosis.     Assessment & Plan   SKIN CANCER SCREENING PERFORMED TODAY.  ACTINIC DAMAGE - Chronic condition, secondary to cumulative UV/sun exposure - diffuse scaly erythematous macules with underlying dyspigmentation - Recommend daily broad spectrum sunscreen SPF 30+ to sun-exposed areas, reapply every 2 hours as needed.  - Staying in the shade or wearing long sleeves, sun glasses (UVA+UVB protection) and wide brim hats (4-inch brim around the entire circumference of the hat) are also recommended for sun protection.  - Call for new or changing lesions.  LENTIGINES, SEBORRHEIC  KERATOSES, HEMANGIOMAS - Benign normal skin lesions - Benign-appearing - Call for any changes  MELANOCYTIC NEVI - Tan-brown and/or pink-flesh-colored symmetric macules and papules - Benign appearing on exam today - Observation - Call clinic for new or changing moles - Recommend daily use of broad spectrum spf 30+ sunscreen to sun-exposed areas.   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  Trauma or pressure from sleeping on the ear or from cell phone use and sun damage may be exacerbating factors.  Treatment may include using a C-shaped airplane neck pillow for sleeping on the side of the head so no pressure is on the ear.  Other treatments include topical or intralesional steroids; liquid nitrogen or laser destruction; shave removal or excision.  The condition can be difficult to treat and persist or recur despite treatment.    AK (actinic keratosis) (4) left ear x 2, right ear x 1, scalp x 1  Actinic keratoses are precancerous spots that appear secondary to cumulative UV radiation exposure/sun exposure over time. They are chronic with expected duration over 1 year. A portion of actinic keratoses will progress to squamous cell carcinoma of the skin. It is not possible to reliably predict which spots will progress to skin cancer and so treatment is recommended to prevent development of skin cancer.  Recommend daily broad spectrum sunscreen SPF 30+ to sun-exposed areas, reapply every 2 hours as needed.  Recommend staying in the shade or wearing long sleeves, sun glasses (UVA+UVB protection) and  wide brim hats (4-inch brim around the entire circumference of the hat). Call for new or changing lesions.   Destruction of lesion - left ear x 2, right ear x 1, scalp x 1  (4) Complexity: simple   Destruction method: cryotherapy   Informed consent: discussed and consent obtained   Timeout:  patient name, date of birth, surgical site, and procedure verified Lesion destroyed using liquid nitrogen: Yes   Region frozen until ice ball extended beyond lesion: Yes   Outcome: patient tolerated procedure well with no complications   Post-procedure details: wound care instructions given    Inflamed seborrheic keratosis left lower eyelid margin  Patient decline treatment today  Observe   DERMATOFIBROMA Exam: 0.6 cm Firm pink/brown papulenodule with dimple sign at the right mid lateral knee Treatment Plan: A dermatofibroma is a benign growth possibly related to trauma, such as an insect bite, cut from shaving, or inflamed acne-type bump.  Treatment options to remove include shave or excision with resulting scar and risk of recurrence.  Since benign-appearing and not bothersome, will observe for now.     Return in about 1 year (around 08/30/2024) for TBSE, hx of Aks .  IAngelique Holm, CMA, am acting as scribe for Armida Sans, MD .   Documentation: I have reviewed the above documentation for accuracy and completeness, and I agree with the above.  Armida Sans, MD

## 2023-08-31 NOTE — Patient Instructions (Addendum)

## 2023-09-02 ENCOUNTER — Encounter: Payer: Self-pay | Admitting: Dermatology

## 2024-09-05 ENCOUNTER — Ambulatory Visit: Payer: 59 | Admitting: Dermatology

## 2024-10-02 ENCOUNTER — Encounter: Payer: Self-pay | Admitting: Dermatology

## 2024-10-02 ENCOUNTER — Ambulatory Visit: Admitting: Dermatology

## 2024-10-02 DIAGNOSIS — D492 Neoplasm of unspecified behavior of bone, soft tissue, and skin: Secondary | ICD-10-CM | POA: Diagnosis not present

## 2024-10-02 DIAGNOSIS — Z1283 Encounter for screening for malignant neoplasm of skin: Secondary | ICD-10-CM | POA: Diagnosis not present

## 2024-10-02 DIAGNOSIS — L281 Prurigo nodularis: Secondary | ICD-10-CM

## 2024-10-02 DIAGNOSIS — D225 Melanocytic nevi of trunk: Secondary | ICD-10-CM | POA: Diagnosis not present

## 2024-10-02 DIAGNOSIS — Z872 Personal history of diseases of the skin and subcutaneous tissue: Secondary | ICD-10-CM

## 2024-10-02 DIAGNOSIS — D229 Melanocytic nevi, unspecified: Secondary | ICD-10-CM

## 2024-10-02 DIAGNOSIS — H61009 Unspecified perichondritis of external ear, unspecified ear: Secondary | ICD-10-CM

## 2024-10-02 DIAGNOSIS — L578 Other skin changes due to chronic exposure to nonionizing radiation: Secondary | ICD-10-CM | POA: Diagnosis not present

## 2024-10-02 DIAGNOSIS — W908XXA Exposure to other nonionizing radiation, initial encounter: Secondary | ICD-10-CM

## 2024-10-02 DIAGNOSIS — L821 Other seborrheic keratosis: Secondary | ICD-10-CM

## 2024-10-02 DIAGNOSIS — L814 Other melanin hyperpigmentation: Secondary | ICD-10-CM

## 2024-10-02 MED ORDER — TRIAMCINOLONE ACETONIDE 10 MG/ML IJ SUSP
10.0000 mg | Freq: Once | INTRAMUSCULAR | Status: AC
  Administered 2024-10-02: 10 mg

## 2024-10-02 NOTE — Progress Notes (Signed)
 Follow-Up Visit   Subjective  Jack Edwards is a 63 y.o. male who presents for the following: Skin Cancer Screening and Full Body Skin Exam, hx of Aks, check spot R knee ~yr, irritating   The patient presents for Total-Body Skin Exam (TBSE) for skin cancer screening and mole check. The patient has spots, moles and lesions to be evaluated, some may be new or changing and the patient may have concern these could be cancer.  The following portions of the chart were reviewed this encounter and updated as appropriate: medications, allergies, medical history  Review of Systems:  No other skin or systemic complaints except as noted in HPI or Assessment and Plan.  Objective  Well appearing patient in no apparent distress; mood and affect are within normal limits.  A full examination was performed including scalp, head, eyes, ears, nose, lips, neck, chest, axillae, abdomen, back, buttocks, bilateral upper extremities, bilateral lower extremities, hands, feet, fingers, toes, fingernails, and toenails. All findings within normal limits unless otherwise noted below.   Relevant physical exam findings are noted in the Assessment and Plan.  R mid lat knee Thickened papule 0.7cm  R side superior Irregular brown macule 0.6cm  R side inferior Irregular brown macule 0.6cm   Assessment & Plan   SKIN CANCER SCREENING PERFORMED TODAY.  ACTINIC DAMAGE - Chronic condition, secondary to cumulative UV/sun exposure - diffuse scaly erythematous macules with underlying dyspigmentation - Recommend daily broad spectrum sunscreen SPF 30+ to sun-exposed areas, reapply every 2 hours as needed.  - Staying in the shade or wearing long sleeves, sun glasses (UVA+UVB protection) and wide brim hats (4-inch brim around the entire circumference of the hat) are also recommended for sun protection.  - Call for new or changing lesions.  LENTIGINES, SEBORRHEIC KERATOSES, HEMANGIOMAS - Benign normal skin  lesions - Benign-appearing - Call for any changes  MELANOCYTIC NEVI - Tan-brown and/or pink-flesh-colored symmetric macules and papules - Benign appearing on exam today - Observation - Call clinic for new or changing moles - Recommend daily use of broad spectrum spf 30+ sunscreen to sun-exposed areas.   HISTORY OF PRECANCEROUS ACTINIC KERATOSIS - site(s) of PreCancerous Actinic Keratosis clear today. 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  Trauma or pressure from sleeping on the ear or from cell phone use and sun damage may be exacerbating factors.  Treatment may include using a C-shaped airplane neck pillow for sleeping on the side of the head so no pressure is on the ear.  Other treatments include topical or intralesional steroids; liquid nitrogen or laser destruction; shave removal or excision.  The condition can be difficult to treat and persist or recur despite treatment.  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  These  areas may be pink, hyperpigmented and may have excoriations.  LSC is commonly observed in  uncontrolled atopic dermatitis and other forms of eczema, and in other itchy skin conditions (eg, insect bites, scabies).  Sometimes it is not possible to know initial cause of LSC if it has been present for a long time.  It generally responds well to treatment with high potency topical steroids.  It is important to stop rubbing/scratching the area in order to break the itch-scratch-rash-itch cycle, in order for the rash to resolve.  Intralesional injection - R mid lat knee Location: R mid lat knee  Informed Consent: Discussed risks (infection, pain, bleeding, bruising, thinning of the skin, loss of skin pigment, lack of resolution, and recurrence of lesion) and benefits of the procedure, as well as the alternatives. Informed consent was obtained. Preparation: The area was prepared a standard fashion.  Procedure Details: An intralesional injection was performed with Kenalog 10 mg/cc. 0.1 cc in total were injected.  Total number of injections: 1  Plan: The patient was instructed on post-op care. Recommend OTC analgesia as needed for pain.  [id number] Lot [id number] Exp 08/2026  Related Medications triamcinolone acetonide (KENALOG) 10 MG/ML injection 10 mg  NEOPLASM OF SKIN (2) R side superior Epidermal / dermal shaving  Lesion diameter (cm):  0.6 Informed consent: discussed and consent obtained   Timeout: patient name, date of birth, surgical site, and procedure verified   Procedure prep:  Patient was prepped and draped in usual sterile fashion Prep type:  Isopropyl alcohol Anesthesia: the lesion was anesthetized in a standard fashion   Anesthetic:  1% lidocaine  w/ epinephrine  1-100,000 buffered w/ 8.4% NaHCO3 Instrument used: flexible razor blade   Hemostasis achieved with: pressure, aluminum chloride and electrodesiccation   Outcome: patient tolerated procedure well   Post-procedure details: sterile dressing applied and wound care instructions given   Dressing type: bandage and  petrolatum    Specimen 1 - Surgical pathology Differential Diagnosis: Nevus vs Dysplastic Nevus  Check Margins: yes Irregular brown macule 0.6cm R side inferior Epidermal / dermal shaving  Lesion diameter (cm):  0.6 Informed consent: discussed and consent obtained   Timeout: patient name, date of birth, surgical site, and procedure verified   Procedure prep:  Patient was prepped and draped in usual sterile fashion Prep type:  Isopropyl alcohol Anesthesia: the lesion was anesthetized in a standard fashion   Anesthetic:  1% lidocaine  w/ epinephrine  1-100,000 buffered w/ 8.4% NaHCO3 Instrument used: flexible razor blade   Hemostasis achieved with: pressure, aluminum chloride and electrodesiccation   Outcome: patient tolerated procedure well   Post-procedure details: sterile dressing applied and wound care instructions given   Dressing type: bandage and petrolatum    Specimen 2 - Surgical pathology Differential Diagnosis: Nevus vs Dysplastic Nevus  Check Margins: yes Irregular brown macule 0.6cm Return in about 4 months (around 02/02/2025) for  Prurigo Nodule f/u, 1 yr TBSE hx of AKs.  I, Grayce Saunas, RMA, am acting as scribe for Alm Rhyme, MD .   Documentation: I have reviewed the above documentation for accuracy and completeness, and I agree with the above.  Alm Rhyme, MD

## 2024-10-02 NOTE — Patient Instructions (Addendum)

## 2024-10-03 ENCOUNTER — Ambulatory Visit: Payer: Self-pay | Admitting: Dermatology

## 2024-10-03 LAB — SURGICAL PATHOLOGY

## 2024-10-04 ENCOUNTER — Encounter: Payer: Self-pay | Admitting: Dermatology

## 2024-10-04 NOTE — Telephone Encounter (Addendum)
 Tried calling patient regarding bx results. No answer. Lm for patient to return call.  ----- Message from Alm Rhyme sent at 10/03/2024  5:55 PM EDT ----- FINAL DIAGNOSIS        1. Skin, right side superior :       DYSPLASTIC COMPOUND NEVUS WITH MODERATE ATYPIA, LIMITED MARGINS FREE        2. Skin, right side inferior :       DYSPLASTIC COMPOUND NEVUS WITH MODERATE ATYPIA, DEEP MARGIN INVOLVED   1&2 - Both Moderate Dysplastic Recheck next visit ----- Message ----- From: Interface, Lab In Three Zero One Sent: 10/03/2024   5:28 PM EDT To: Alm JAYSON Rhyme, MD

## 2024-10-04 NOTE — Telephone Encounter (Addendum)
 Returned call to patient. Spoke with him regarding bx results. He verbalized understanding and denied further questions. Will recheck at next follow up----- Message from Jack Edwards sent at 10/03/2024  5:55 PM EDT ----- FINAL DIAGNOSIS        1. Skin, right side superior :       DYSPLASTIC COMPOUND NEVUS WITH MODERATE ATYPIA, LIMITED MARGINS FREE        2. Skin, right side inferior :       DYSPLASTIC COMPOUND NEVUS WITH MODERATE ATYPIA, DEEP MARGIN INVOLVED   1&2 - Both Moderate Dysplastic Recheck next visit ----- Message ----- From: Interface, Lab In Three Zero One Sent: 10/03/2024   5:28 PM EDT To: Jack JAYSON Rhyme, MD

## 2025-01-29 ENCOUNTER — Ambulatory Visit: Admitting: Dermatology

## 2025-10-08 ENCOUNTER — Ambulatory Visit: Admitting: Dermatology
